# Patient Record
Sex: Female | Born: 1935 | Race: White | Hispanic: No | Smoking: Never smoker
Health system: Southern US, Community
[De-identification: ages and names within clinical notes are randomized; demographics above are authoritative.]

## PROBLEM LIST (undated history)

## (undated) DIAGNOSIS — E539 Vitamin B deficiency, unspecified: Secondary | ICD-10-CM

## (undated) DIAGNOSIS — C801 Malignant (primary) neoplasm, unspecified: Secondary | ICD-10-CM

## (undated) DIAGNOSIS — R112 Nausea with vomiting, unspecified: Secondary | ICD-10-CM

## (undated) DIAGNOSIS — I82409 Acute embolism and thrombosis of unspecified deep veins of unspecified lower extremity: Secondary | ICD-10-CM

## (undated) DIAGNOSIS — K219 Gastro-esophageal reflux disease without esophagitis: Secondary | ICD-10-CM

## (undated) DIAGNOSIS — N390 Urinary tract infection, site not specified: Secondary | ICD-10-CM

## (undated) DIAGNOSIS — T8859XA Other complications of anesthesia, initial encounter: Secondary | ICD-10-CM

## (undated) DIAGNOSIS — R42 Dizziness and giddiness: Secondary | ICD-10-CM

## (undated) DIAGNOSIS — R490 Dysphonia: Secondary | ICD-10-CM

## (undated) DIAGNOSIS — I739 Peripheral vascular disease, unspecified: Secondary | ICD-10-CM

## (undated) DIAGNOSIS — M1990 Unspecified osteoarthritis, unspecified site: Secondary | ICD-10-CM

## (undated) DIAGNOSIS — D759 Disease of blood and blood-forming organs, unspecified: Secondary | ICD-10-CM

## (undated) DIAGNOSIS — Z8249 Family history of ischemic heart disease and other diseases of the circulatory system: Secondary | ICD-10-CM

## (undated) DIAGNOSIS — Z789 Other specified health status: Secondary | ICD-10-CM

## (undated) DIAGNOSIS — Z9889 Other specified postprocedural states: Secondary | ICD-10-CM

## (undated) DIAGNOSIS — E559 Vitamin D deficiency, unspecified: Secondary | ICD-10-CM

## (undated) DIAGNOSIS — D6851 Activated protein C resistance: Secondary | ICD-10-CM

## (undated) DIAGNOSIS — R011 Cardiac murmur, unspecified: Secondary | ICD-10-CM

## (undated) DIAGNOSIS — T4145XA Adverse effect of unspecified anesthetic, initial encounter: Secondary | ICD-10-CM

## (undated) DIAGNOSIS — Z8719 Personal history of other diseases of the digestive system: Secondary | ICD-10-CM

## (undated) HISTORY — PX: BUNIONECTOMY: SHX129

## (undated) HISTORY — PX: TONSILLECTOMY: SUR1361

## (undated) HISTORY — DX: Dizziness and giddiness: R42

## (undated) HISTORY — DX: Vitamin B deficiency, unspecified: E53.9

## (undated) HISTORY — DX: Dysphonia: R49.0

## (undated) HISTORY — DX: Family history of ischemic heart disease and other diseases of the circulatory system: Z82.49

## (undated) HISTORY — PX: APPENDECTOMY: SHX54

## (undated) HISTORY — PX: CHOLECYSTECTOMY: SHX55

## (undated) HISTORY — DX: Acute embolism and thrombosis of unspecified deep veins of unspecified lower extremity: I82.409

## (undated) HISTORY — PX: ABDOMINAL HYSTERECTOMY: SHX81

## (undated) HISTORY — DX: Vitamin D deficiency, unspecified: E55.9

## (undated) HISTORY — PX: COLONOSCOPY W/ POLYPECTOMY: SHX1380

## (undated) HISTORY — DX: Activated protein C resistance: D68.51

## (undated) HISTORY — PX: HERNIA REPAIR: SHX51

---

## 1997-01-19 HISTORY — PX: CATARACT EXTRACTION: SUR2

## 2005-01-19 HISTORY — PX: JOINT REPLACEMENT: SHX530

## 2013-01-30 DIAGNOSIS — M25569 Pain in unspecified knee: Secondary | ICD-10-CM | POA: Diagnosis not present

## 2013-01-30 DIAGNOSIS — IMO0002 Reserved for concepts with insufficient information to code with codable children: Secondary | ICD-10-CM | POA: Diagnosis not present

## 2013-01-30 DIAGNOSIS — M1710 Unilateral primary osteoarthritis, unspecified knee: Secondary | ICD-10-CM | POA: Diagnosis not present

## 2013-01-31 DIAGNOSIS — M25569 Pain in unspecified knee: Secondary | ICD-10-CM | POA: Diagnosis not present

## 2013-01-31 DIAGNOSIS — M79609 Pain in unspecified limb: Secondary | ICD-10-CM | POA: Diagnosis not present

## 2013-02-07 DIAGNOSIS — Z1231 Encounter for screening mammogram for malignant neoplasm of breast: Secondary | ICD-10-CM | POA: Diagnosis not present

## 2013-02-07 LAB — HM MAMMOGRAPHY: HM MAMMO: NEGATIVE

## 2013-02-09 DIAGNOSIS — IMO0002 Reserved for concepts with insufficient information to code with codable children: Secondary | ICD-10-CM | POA: Diagnosis not present

## 2013-02-09 DIAGNOSIS — M25559 Pain in unspecified hip: Secondary | ICD-10-CM | POA: Diagnosis not present

## 2013-02-09 DIAGNOSIS — M25569 Pain in unspecified knee: Secondary | ICD-10-CM | POA: Diagnosis not present

## 2013-02-09 DIAGNOSIS — M1710 Unilateral primary osteoarthritis, unspecified knee: Secondary | ICD-10-CM | POA: Diagnosis not present

## 2013-03-08 DIAGNOSIS — M1710 Unilateral primary osteoarthritis, unspecified knee: Secondary | ICD-10-CM | POA: Diagnosis not present

## 2013-03-16 DIAGNOSIS — M7989 Other specified soft tissue disorders: Secondary | ICD-10-CM | POA: Diagnosis not present

## 2013-03-16 DIAGNOSIS — I82409 Acute embolism and thrombosis of unspecified deep veins of unspecified lower extremity: Secondary | ICD-10-CM | POA: Diagnosis not present

## 2013-03-17 DIAGNOSIS — I82409 Acute embolism and thrombosis of unspecified deep veins of unspecified lower extremity: Secondary | ICD-10-CM | POA: Diagnosis not present

## 2013-03-20 DIAGNOSIS — Z7901 Long term (current) use of anticoagulants: Secondary | ICD-10-CM | POA: Diagnosis not present

## 2013-03-20 DIAGNOSIS — I824Y9 Acute embolism and thrombosis of unspecified deep veins of unspecified proximal lower extremity: Secondary | ICD-10-CM | POA: Diagnosis not present

## 2013-03-23 DIAGNOSIS — I824Y9 Acute embolism and thrombosis of unspecified deep veins of unspecified proximal lower extremity: Secondary | ICD-10-CM | POA: Diagnosis not present

## 2013-03-23 DIAGNOSIS — Z7901 Long term (current) use of anticoagulants: Secondary | ICD-10-CM | POA: Diagnosis not present

## 2013-03-30 DIAGNOSIS — Z7901 Long term (current) use of anticoagulants: Secondary | ICD-10-CM | POA: Diagnosis not present

## 2013-03-30 DIAGNOSIS — I824Y9 Acute embolism and thrombosis of unspecified deep veins of unspecified proximal lower extremity: Secondary | ICD-10-CM | POA: Diagnosis not present

## 2013-04-05 DIAGNOSIS — I824Y9 Acute embolism and thrombosis of unspecified deep veins of unspecified proximal lower extremity: Secondary | ICD-10-CM | POA: Diagnosis not present

## 2013-04-05 DIAGNOSIS — R29818 Other symptoms and signs involving the nervous system: Secondary | ICD-10-CM | POA: Diagnosis not present

## 2013-04-05 DIAGNOSIS — R42 Dizziness and giddiness: Secondary | ICD-10-CM | POA: Diagnosis not present

## 2013-04-05 DIAGNOSIS — Z7901 Long term (current) use of anticoagulants: Secondary | ICD-10-CM | POA: Diagnosis not present

## 2013-04-12 DIAGNOSIS — I824Y9 Acute embolism and thrombosis of unspecified deep veins of unspecified proximal lower extremity: Secondary | ICD-10-CM | POA: Diagnosis not present

## 2013-04-12 DIAGNOSIS — Z7901 Long term (current) use of anticoagulants: Secondary | ICD-10-CM | POA: Diagnosis not present

## 2013-04-20 DIAGNOSIS — Z7901 Long term (current) use of anticoagulants: Secondary | ICD-10-CM | POA: Diagnosis not present

## 2013-04-20 DIAGNOSIS — I824Y9 Acute embolism and thrombosis of unspecified deep veins of unspecified proximal lower extremity: Secondary | ICD-10-CM | POA: Diagnosis not present

## 2013-04-27 DIAGNOSIS — Z7901 Long term (current) use of anticoagulants: Secondary | ICD-10-CM | POA: Diagnosis not present

## 2013-04-27 DIAGNOSIS — I824Y9 Acute embolism and thrombosis of unspecified deep veins of unspecified proximal lower extremity: Secondary | ICD-10-CM | POA: Diagnosis not present

## 2013-05-03 DIAGNOSIS — S8000XA Contusion of unspecified knee, initial encounter: Secondary | ICD-10-CM | POA: Diagnosis not present

## 2013-05-11 DIAGNOSIS — Z7901 Long term (current) use of anticoagulants: Secondary | ICD-10-CM | POA: Diagnosis not present

## 2013-05-11 DIAGNOSIS — I824Y9 Acute embolism and thrombosis of unspecified deep veins of unspecified proximal lower extremity: Secondary | ICD-10-CM | POA: Diagnosis not present

## 2013-05-26 DIAGNOSIS — Z7901 Long term (current) use of anticoagulants: Secondary | ICD-10-CM | POA: Diagnosis not present

## 2013-05-26 DIAGNOSIS — I824Y9 Acute embolism and thrombosis of unspecified deep veins of unspecified proximal lower extremity: Secondary | ICD-10-CM | POA: Diagnosis not present

## 2013-06-16 DIAGNOSIS — Z7901 Long term (current) use of anticoagulants: Secondary | ICD-10-CM | POA: Diagnosis not present

## 2013-06-16 DIAGNOSIS — I824Y9 Acute embolism and thrombosis of unspecified deep veins of unspecified proximal lower extremity: Secondary | ICD-10-CM | POA: Diagnosis not present

## 2013-06-26 DIAGNOSIS — N390 Urinary tract infection, site not specified: Secondary | ICD-10-CM | POA: Diagnosis not present

## 2013-06-26 DIAGNOSIS — R339 Retention of urine, unspecified: Secondary | ICD-10-CM | POA: Diagnosis not present

## 2013-07-06 DIAGNOSIS — IMO0002 Reserved for concepts with insufficient information to code with codable children: Secondary | ICD-10-CM | POA: Diagnosis not present

## 2013-07-06 DIAGNOSIS — I82409 Acute embolism and thrombosis of unspecified deep veins of unspecified lower extremity: Secondary | ICD-10-CM | POA: Diagnosis not present

## 2013-07-06 DIAGNOSIS — M1710 Unilateral primary osteoarthritis, unspecified knee: Secondary | ICD-10-CM | POA: Diagnosis not present

## 2013-07-07 DIAGNOSIS — N390 Urinary tract infection, site not specified: Secondary | ICD-10-CM | POA: Diagnosis not present

## 2013-07-13 DIAGNOSIS — M25569 Pain in unspecified knee: Secondary | ICD-10-CM | POA: Diagnosis not present

## 2013-07-13 DIAGNOSIS — I82409 Acute embolism and thrombosis of unspecified deep veins of unspecified lower extremity: Secondary | ICD-10-CM | POA: Diagnosis not present

## 2013-07-13 DIAGNOSIS — Z5181 Encounter for therapeutic drug level monitoring: Secondary | ICD-10-CM | POA: Diagnosis not present

## 2013-07-13 DIAGNOSIS — Z7901 Long term (current) use of anticoagulants: Secondary | ICD-10-CM | POA: Diagnosis not present

## 2013-08-04 DIAGNOSIS — I82409 Acute embolism and thrombosis of unspecified deep veins of unspecified lower extremity: Secondary | ICD-10-CM | POA: Diagnosis not present

## 2013-08-04 DIAGNOSIS — E785 Hyperlipidemia, unspecified: Secondary | ICD-10-CM | POA: Diagnosis not present

## 2013-08-05 DIAGNOSIS — I82409 Acute embolism and thrombosis of unspecified deep veins of unspecified lower extremity: Secondary | ICD-10-CM | POA: Diagnosis not present

## 2013-08-05 DIAGNOSIS — E785 Hyperlipidemia, unspecified: Secondary | ICD-10-CM | POA: Diagnosis not present

## 2013-08-10 DIAGNOSIS — Z5181 Encounter for therapeutic drug level monitoring: Secondary | ICD-10-CM | POA: Diagnosis not present

## 2013-08-10 DIAGNOSIS — Z7901 Long term (current) use of anticoagulants: Secondary | ICD-10-CM | POA: Diagnosis not present

## 2013-08-10 DIAGNOSIS — I82409 Acute embolism and thrombosis of unspecified deep veins of unspecified lower extremity: Secondary | ICD-10-CM | POA: Diagnosis not present

## 2013-08-16 DIAGNOSIS — E559 Vitamin D deficiency, unspecified: Secondary | ICD-10-CM | POA: Diagnosis not present

## 2013-08-16 DIAGNOSIS — D6859 Other primary thrombophilia: Secondary | ICD-10-CM | POA: Diagnosis not present

## 2013-08-16 DIAGNOSIS — E785 Hyperlipidemia, unspecified: Secondary | ICD-10-CM | POA: Diagnosis not present

## 2013-09-12 DIAGNOSIS — I824Y9 Acute embolism and thrombosis of unspecified deep veins of unspecified proximal lower extremity: Secondary | ICD-10-CM | POA: Diagnosis not present

## 2013-09-12 DIAGNOSIS — Z7901 Long term (current) use of anticoagulants: Secondary | ICD-10-CM | POA: Diagnosis not present

## 2013-10-02 ENCOUNTER — Telehealth: Payer: Self-pay | Admitting: Family Medicine

## 2013-10-02 NOTE — Telephone Encounter (Signed)
Note forwarded to [place].

## 2013-10-02 NOTE — Telephone Encounter (Signed)
I believe there was a misunderstanding. I said I might be able to help get her in sooner then next year. Not immediately - but will make an exception given daughter's concers if needed and can schedule in next new patient slot or block 2 15 minute slots for a new patient visit. She will need to do labs with current doctor for this month until sees [place] to establish care as can't do labs here until establishes care here.

## 2013-10-02 NOTE — Telephone Encounter (Signed)
Caller: Rebecca/Child; Phone: [phone]; Reason for Call: Rebecca/daughter calling about patient who is trying to establish with office with Dr Maudie Mercury as her physician.   Daughter spoke with Dr Maudie Mercury over the last weekend, was instructed to bring records to office and could get established in practice as well as to be able to get labs done as needed since on Coumadin for blood clot.  Right now needing labs monthly and is due for next lab.   History: Blood clot due to long drive from [place] to [place] in spring and after being stablized was allowed to return to [place].  Moved to [place] area in August - lives at the [facility].  (80 y.o.  Mother has been driving 2 hours to and from [place] to get labs done).   Patient came to office this am 9/14, could not get anyone in office to schedule appointment, was not allowed to speak with nurse, told earliest she could be seen is in Feb 2016.   Daughter very concerned about long drive for patient as well as trying to follow Dr Julianne Rice instructions to have her seen in office.   Please review - contact daughter at [phone].  Or patient can be reached at [phone].

## 2013-10-03 NOTE — Telephone Encounter (Signed)
Spoke with Pt and was able to find a NP appt on 09/24 so I did not advise her to have labs done elsewhere. Pt was satisfied.

## 2013-10-04 ENCOUNTER — Ambulatory Visit: Payer: Self-pay | Admitting: Family Medicine

## 2013-10-12 ENCOUNTER — Telehealth: Payer: Self-pay | Admitting: Family

## 2013-10-12 ENCOUNTER — Ambulatory Visit (INDEPENDENT_AMBULATORY_CARE_PROVIDER_SITE_OTHER): Payer: Medicare Other | Admitting: Family Medicine

## 2013-10-12 ENCOUNTER — Ambulatory Visit (INDEPENDENT_AMBULATORY_CARE_PROVIDER_SITE_OTHER): Payer: Medicare Other | Admitting: Family

## 2013-10-12 ENCOUNTER — Encounter: Payer: Self-pay | Admitting: Family Medicine

## 2013-10-12 VITALS — BP 122/80 | HR 88 | Temp 97.7°F | Ht 64.25 in | Wt 159.0 lb

## 2013-10-12 DIAGNOSIS — R339 Retention of urine, unspecified: Secondary | ICD-10-CM | POA: Diagnosis not present

## 2013-10-12 DIAGNOSIS — Z8249 Family history of ischemic heart disease and other diseases of the circulatory system: Secondary | ICD-10-CM | POA: Insufficient documentation

## 2013-10-12 DIAGNOSIS — Z7689 Persons encountering health services in other specified circumstances: Secondary | ICD-10-CM

## 2013-10-12 DIAGNOSIS — I82401 Acute embolism and thrombosis of unspecified deep veins of right lower extremity: Secondary | ICD-10-CM

## 2013-10-12 DIAGNOSIS — D6851 Activated protein C resistance: Secondary | ICD-10-CM | POA: Insufficient documentation

## 2013-10-12 DIAGNOSIS — Z23 Encounter for immunization: Secondary | ICD-10-CM

## 2013-10-12 DIAGNOSIS — M1990 Unspecified osteoarthritis, unspecified site: Secondary | ICD-10-CM | POA: Diagnosis not present

## 2013-10-12 DIAGNOSIS — I82409 Acute embolism and thrombosis of unspecified deep veins of unspecified lower extremity: Secondary | ICD-10-CM | POA: Diagnosis not present

## 2013-10-12 DIAGNOSIS — R490 Dysphonia: Secondary | ICD-10-CM

## 2013-10-12 DIAGNOSIS — D6859 Other primary thrombophilia: Secondary | ICD-10-CM

## 2013-10-12 DIAGNOSIS — Z7189 Other specified counseling: Secondary | ICD-10-CM | POA: Diagnosis not present

## 2013-10-12 DIAGNOSIS — J029 Acute pharyngitis, unspecified: Secondary | ICD-10-CM

## 2013-10-12 HISTORY — DX: Family history of ischemic heart disease and other diseases of the circulatory system: Z82.49

## 2013-10-12 LAB — POCT INR: INR: 4

## 2013-10-12 NOTE — Patient Instructions (Signed)
-  check on when you last had tdap  -We have ordered labs or studies at this visit. It can take up to 1-2 weeks for results and processing. We will contact you with instructions IF your results are abnormal. Normal results will be released to your [facility]. If you have not heard from [place] or can not find your results in [facility] in 2 weeks please contact our office.  -We placed a referral for you as discussed to the urologist. It usually takes about 1-2 weeks to process and schedule this referral. If you have not heard from [place] regarding this appointment in 2 weeks please contact our office.   -PLEASE SIGN UP FOR MYCHART TODAY   We recommend the following healthy lifestyle measures: - eat a healthy diet consisting of lots of vegetables, fruits, beans, nuts, seeds, healthy meats such as white chicken and fish and whole grains.  - avoid fried foods, fast food, processed foods, sodas, red meet and other fattening foods.  - get a least 150 minutes of aerobic exercise per week.   Follow up in: 3 months

## 2013-10-12 NOTE — Addendum Note (Signed)
Addended by: Agnes Lawrence on: 10/12/2013 05:35 PM   Modules accepted: Orders

## 2013-10-12 NOTE — Progress Notes (Signed)
Pre visit review using our clinic review tool, if applicable. No additional management support is needed unless otherwise documented below in the visit note. 

## 2013-10-12 NOTE — Telephone Encounter (Signed)
Hold Coumadin Thursday and Friday. Resume on Saturday, 5mg  everyday except M/W/F, 2.5mg . Recheck in 10 days.    Please provide instructions and schedule a follow-up.

## 2013-10-12 NOTE — Patient Instructions (Addendum)
Hold Coumadin Thursday and Friday. Resume on Saturday, 5mg  everyday except M/W/F, 2.5mg . Recheck in 10 days.   Anticoagulation Dose Instructions as of 10/12/2013     Dorene Grebe Tue Wed Thu Fri Sat   New Dose 5 mg 2.5 mg 5 mg 2.5 mg 5 mg 2.5 mg 5 mg    Description       Hold Coumadin Thursday and Friday. Resume on Saturday, 5mg  everyday except M/W/F, 2.5mg . Recheck in 10 days.

## 2013-10-12 NOTE — Progress Notes (Addendum)
No chief complaint on file.   HPI:  Ziara Thelander is here to establish care. Moved to [place] 5 weeks ago. Last PCP and physical: had physical exam in June  Has the following chronic problems and concerns today:  Patient Active Problem List   Diagnosis Date Noted  . DVT (deep venous thrombosis) 10/12/2013  . Arthritis, senescent 10/12/2013  . Urinary retention 10/12/2013  . Heterozygous factor V Leiden mutation 10/12/2013  . FH: pulmonary embolism 10/12/2013   DVT: -1st occurrence, occurred spontaneously after a 12 hour car ride in 02/2013 -she has been on coumadin for this  - heterozygous for factor V leiden -last inr about 1 month ago and in normal range per her report -current dose of coumadin is 2.5 mg MTFr, 5mg  all other days -FH 1st degree relative with pulm embolism -no hx bleeding, bruising, malignancy  Urinary Retention: -self caths 3 times per day -followed by a urologist in [place] - now wants to see urologist here in [place]  Vocal Chord issues: -reports pain and squeaking when singing -wants evaluation for this  ROS negative for unless reported above: fevers, unintentional weight loss, hearing or vision loss, chest pain, palpitations, struggling to breath, hemoptysis, melena, hematochezia, hematuria, falls, loc, si, thoughts of self harm  Past Medical History  Diagnosis Date  . Vitamin D deficiency   . Vitamin B deficiency   . Urinary incontinence   . DVT (deep venous thrombosis)     left lower extremity  . Heterozygous factor V Leiden mutation     Family History  Problem Relation Age of Onset  . Heart disease Mother     chf  . Cancer Father     died at age 60    History   Social History  . Marital Status: Widowed    Spouse Name: N/A    Number of Children: N/A  . Years of Education: N/A   Social History Main Topics  . Smoking status: Never Smoker   . Smokeless tobacco: None  . Alcohol Use: Yes     Comment: glass on a rare occ   . Drug Use: None  . Sexual Activity: None   Other Topics Concern  . None   Social History Narrative   Work or School: volunteers with music group      Home Situation: lives in [place], religious      Lifestyle: active at residence, walking once per week, diet is ok             Current outpatient prescriptions:acetaminophen (TYLENOL) 500 MG tablet, Take 500 mg by mouth every 6 (six) hours as needed (foot pain)., Disp: , Rfl: ;  B Complex Vitamins (B COMPLEX PO), Take by mouth daily., Disp: , Rfl: ;  Cholecalciferol (VITAMIN D PO), Take by mouth daily., Disp: , Rfl: ;  Omega-3 Fatty Acids (FISH OIL) 1000 MG CAPS, Take by mouth daily., Disp: , Rfl:  warfarin (COUMADIN) 5 MG tablet, 5 mg. Takes 2.5mg  Mon Tues and Friday-5mg  other days, Disp: , Rfl:   EXAM:  Filed Vitals:   10/12/13 1617  BP: 122/80  Pulse: 88  Temp: 97.7 F (36.5 C)    Body mass index is 27.08 kg/(m^2).  GENERAL: vitals reviewed and listed above, alert, oriented, appears well hydrated and in no acute distress  HEENT: atraumatic, conjunttiva clear, no obvious abnormalities on inspection of external nose and ears  NECK: no obvious masses on inspection  LUNGS: clear to auscultation bilaterally, no wheezes, rales or rhonchi, good air movement  CV: HRRR, no peripheral edema  MS: moves all extremities without noticeable abnormality  PSYCH: pleasant and cooperative, no obvious depression or anxiety  ASSESSMENT AND PLAN:  Discussed the following assessment and plan:  DVT (deep venous thrombosis), right -discussed treatment, options, risks/benefits -she has opted for continuation of treament with coumadin -inr today, she is to establish with coumadin clinic  Encounter to establish care  Urinary retention - Plan: Ambulatory referral to Urology  Osteoarthritis, unspecified osteoarthritis type, unspecified site  Heterozygous factor V Leiden mutation    -We reviewed the PMH, PSH, FH, SH, Meds and Allergies. -We provided refills for any medications we will prescribe as needed. -We addressed current concerns per orders and patient instructions. -We have asked for records for pertinent exams, studies, vaccines and notes from previous providers. -We have advised patient to follow up per instructions below. -prevnar 13 today,  -influenzae vaccine -provided number to contact ENT about her voice issues after discussion potential etiologies  -Patient advised to return or notify a doctor immediately if symptoms worsen or persist or new concerns arise.  Patient Instructions  -check on when you last had tdap  -We have ordered labs or studies at this visit. It can take up to 1-2 weeks for results and processing. We will contact you with instructions IF your results are abnormal. Normal results will be released to your [facility]. If you have not heard from [place] or can not find your results in [facility] in 2 weeks please contact our office.  -We placed a referral for you as discussed to the urologist. It usually takes about 1-2 weeks to process and schedule this referral. If you have not heard from [place] regarding this appointment in 2 weeks please contact our office.   -PLEASE SIGN UP FOR MYCHART TODAY   We recommend the following healthy lifestyle measures: - eat a healthy diet consisting of lots of vegetables, fruits, beans, nuts, seeds, healthy meats such as white chicken and fish and whole grains.  - avoid fried foods, fast food, processed foods, sodas, red meet and other fattening foods.  - get a least 150 minutes of aerobic exercise per week.   Follow up in: 3 months        KIM, HANNAH R.

## 2013-10-13 ENCOUNTER — Ambulatory Visit: Payer: Self-pay | Admitting: Family Medicine

## 2013-10-16 NOTE — Telephone Encounter (Signed)
Left message for pt to call back  °

## 2013-10-16 NOTE — Telephone Encounter (Signed)
Pt did receive a call with coumadin instructions and appointment was scheduled

## 2013-10-20 ENCOUNTER — Encounter: Payer: Self-pay | Admitting: Family Medicine

## 2013-10-23 DIAGNOSIS — J329 Chronic sinusitis, unspecified: Secondary | ICD-10-CM | POA: Diagnosis not present

## 2013-10-23 DIAGNOSIS — J387 Other diseases of larynx: Secondary | ICD-10-CM | POA: Diagnosis not present

## 2013-10-24 ENCOUNTER — Ambulatory Visit (INDEPENDENT_AMBULATORY_CARE_PROVIDER_SITE_OTHER): Payer: Medicare Other | Admitting: Family

## 2013-10-24 DIAGNOSIS — D688 Other specified coagulation defects: Secondary | ICD-10-CM

## 2013-10-24 DIAGNOSIS — Z8679 Personal history of other diseases of the circulatory system: Secondary | ICD-10-CM | POA: Diagnosis not present

## 2013-10-24 DIAGNOSIS — D6851 Activated protein C resistance: Secondary | ICD-10-CM

## 2013-10-24 DIAGNOSIS — Z8249 Family history of ischemic heart disease and other diseases of the circulatory system: Secondary | ICD-10-CM

## 2013-10-24 DIAGNOSIS — I82401 Acute embolism and thrombosis of unspecified deep veins of right lower extremity: Secondary | ICD-10-CM | POA: Diagnosis not present

## 2013-10-24 LAB — POCT INR: INR: 2.1

## 2013-10-24 NOTE — Patient Instructions (Signed)
5mg  everyday except M/W/F, 2.5mg . Recheck in 4 weeks.   Anticoagulation Dose Instructions as of 10/24/2013     Dorene Grebe Tue Wed Thu Fri Sat   New Dose 5 mg 2.5 mg 5 mg 2.5 mg 5 mg 2.5 mg 5 mg    Description       5mg  everyday except M/W/F, 2.5mg . Recheck in 4 weeks.

## 2013-10-25 ENCOUNTER — Encounter: Payer: Self-pay | Admitting: Family Medicine

## 2013-10-26 ENCOUNTER — Encounter: Payer: Self-pay | Admitting: Family Medicine

## 2013-11-10 DIAGNOSIS — R339 Retention of urine, unspecified: Secondary | ICD-10-CM | POA: Diagnosis not present

## 2013-11-10 DIAGNOSIS — N3942 Incontinence without sensory awareness: Secondary | ICD-10-CM | POA: Diagnosis not present

## 2013-11-10 DIAGNOSIS — N1330 Unspecified hydronephrosis: Secondary | ICD-10-CM | POA: Diagnosis not present

## 2013-11-20 ENCOUNTER — Encounter: Payer: Self-pay | Admitting: Family Medicine

## 2013-11-21 ENCOUNTER — Ambulatory Visit (INDEPENDENT_AMBULATORY_CARE_PROVIDER_SITE_OTHER): Payer: Medicare Other | Admitting: Family

## 2013-11-21 DIAGNOSIS — I82401 Acute embolism and thrombosis of unspecified deep veins of right lower extremity: Secondary | ICD-10-CM

## 2013-11-21 DIAGNOSIS — Z8679 Personal history of other diseases of the circulatory system: Secondary | ICD-10-CM | POA: Diagnosis not present

## 2013-11-21 DIAGNOSIS — D6851 Activated protein C resistance: Secondary | ICD-10-CM

## 2013-11-21 DIAGNOSIS — Z8249 Family history of ischemic heart disease and other diseases of the circulatory system: Secondary | ICD-10-CM

## 2013-11-21 DIAGNOSIS — D688 Other specified coagulation defects: Secondary | ICD-10-CM

## 2013-11-21 LAB — POCT INR: INR: 3

## 2013-11-21 NOTE — Patient Instructions (Signed)
5mg  everyday except M/W/F, 2.5mg . Recheck in 4 weeks.   Anticoagulation Dose Instructions as of 11/21/2013      Dorene Grebe Tue Wed Thu Fri Sat   New Dose 5 mg 2.5 mg 5 mg 2.5 mg 5 mg 2.5 mg 5 mg    Description        5mg  everyday except M/W/F, 2.5mg . Recheck in 4 weeks.

## 2013-12-21 ENCOUNTER — Ambulatory Visit (INDEPENDENT_AMBULATORY_CARE_PROVIDER_SITE_OTHER): Payer: Medicare Other | Admitting: Family

## 2013-12-21 DIAGNOSIS — D6851 Activated protein C resistance: Secondary | ICD-10-CM

## 2013-12-21 DIAGNOSIS — Z8679 Personal history of other diseases of the circulatory system: Secondary | ICD-10-CM | POA: Diagnosis not present

## 2013-12-21 DIAGNOSIS — I82401 Acute embolism and thrombosis of unspecified deep veins of right lower extremity: Secondary | ICD-10-CM

## 2013-12-21 DIAGNOSIS — Z8249 Family history of ischemic heart disease and other diseases of the circulatory system: Secondary | ICD-10-CM

## 2013-12-21 DIAGNOSIS — D688 Other specified coagulation defects: Secondary | ICD-10-CM

## 2013-12-21 LAB — POCT INR: INR: 5

## 2013-12-21 NOTE — Patient Instructions (Signed)
Hold Coumadin Thursday, Friday, and Saturday. Resume on Sunday. Take 1/2 tab daily except 1 tab on Tuesday and Thursdays. Recheck in 10 days.   Anticoagulation Dose Instructions as of 12/21/2013      Dorene Grebe Tue Wed Thu Fri Sat   New Dose 2.5 mg 2.5 mg 5 mg 2.5 mg 5 mg 2.5 mg 2.5 mg    Description        Hold Coumadin Thursday, Friday, and Saturday. Resume on Sunday. Take 1/2 tab daily except 1 tab on Tuesday and Thursdays. Recheck in 10 days.

## 2013-12-25 DIAGNOSIS — R0982 Postnasal drip: Secondary | ICD-10-CM | POA: Diagnosis not present

## 2013-12-25 DIAGNOSIS — J387 Other diseases of larynx: Secondary | ICD-10-CM | POA: Diagnosis not present

## 2013-12-26 ENCOUNTER — Encounter: Payer: Self-pay | Admitting: Family Medicine

## 2014-01-01 ENCOUNTER — Ambulatory Visit (INDEPENDENT_AMBULATORY_CARE_PROVIDER_SITE_OTHER): Payer: Medicare Other | Admitting: Family

## 2014-01-01 DIAGNOSIS — D688 Other specified coagulation defects: Secondary | ICD-10-CM

## 2014-01-01 DIAGNOSIS — I82401 Acute embolism and thrombosis of unspecified deep veins of right lower extremity: Secondary | ICD-10-CM

## 2014-01-01 DIAGNOSIS — Z8249 Family history of ischemic heart disease and other diseases of the circulatory system: Secondary | ICD-10-CM

## 2014-01-01 DIAGNOSIS — D6851 Activated protein C resistance: Secondary | ICD-10-CM

## 2014-01-01 DIAGNOSIS — Z8679 Personal history of other diseases of the circulatory system: Secondary | ICD-10-CM

## 2014-01-01 LAB — POCT INR: INR: 2

## 2014-01-10 ENCOUNTER — Ambulatory Visit (INDEPENDENT_AMBULATORY_CARE_PROVIDER_SITE_OTHER): Payer: Medicare Other | Admitting: Family Medicine

## 2014-01-10 ENCOUNTER — Encounter: Payer: Self-pay | Admitting: Family Medicine

## 2014-01-10 VITALS — BP 126/74 | HR 86 | Temp 98.1°F | Ht 64.25 in | Wt 159.0 lb

## 2014-01-10 DIAGNOSIS — J387 Other diseases of larynx: Secondary | ICD-10-CM | POA: Diagnosis not present

## 2014-01-10 DIAGNOSIS — R339 Retention of urine, unspecified: Secondary | ICD-10-CM

## 2014-01-10 DIAGNOSIS — I82401 Acute embolism and thrombosis of unspecified deep veins of right lower extremity: Secondary | ICD-10-CM | POA: Diagnosis not present

## 2014-01-10 DIAGNOSIS — K219 Gastro-esophageal reflux disease without esophagitis: Secondary | ICD-10-CM

## 2014-01-10 NOTE — Progress Notes (Signed)
Pre visit review using our clinic review tool, if applicable. No additional management support is needed unless otherwise documented below in the visit note. 

## 2014-01-10 NOTE — Progress Notes (Signed)
HPI:  DVT: -1st occurrence, occurred spontaneously after a 12 hour car ride in 02/2013 -she has been on coumadin for this and has opted for chronic coumadin at the last visit - but now she has changed her mind and absolutely does not want to be on coumadin any longer due to a fear of bleeding - heterozygous for factor V leiden -last inr about 1 month ago and in normal range per her report -stopped coumadin recently -no hx bleeding, bruising, malignancy  Urinary Retention: -self caths 3 times per day -followed by a urologist in [place] - referred to urologist here -seeing urologist  Vocal Chord issues: -reports pain and squeaking when singing -wanted to see ENT and options provided last visit -seeing [facility] and taking omeprazole for this and doing well  ROS: See pertinent positives and negatives per HPI.  Past Medical History  Diagnosis Date  . Vitamin D deficiency   . Vitamin B deficiency   . Urinary incontinence   . DVT (deep venous thrombosis)     left lower extremity  . Heterozygous factor V Leiden mutation     Past Surgical History  Procedure Laterality Date  . Bunionectomy    . Hernia repair    . Joint replacement  2007    hip  . Cataract extraction  1999  . Cholecystectomy    . Appendectomy    . Tonsillectomy    . Abdominal hysterectomy      Family History  Problem Relation Age of Onset  . Heart disease Mother     chf  . Cancer Father     died at age 75    History   Social History  . Marital Status: Widowed    Spouse Name: N/A    Number of Children: N/A  . Years of Education: N/A   Social History Main Topics  . Smoking status: Never Smoker   . Smokeless tobacco: None  . Alcohol Use: Yes     Comment: glass on a rare occ  . Drug Use: None  . Sexual Activity: None   Other Topics Concern  . None   Social History Narrative   Work or School: volunteers with music group      Home Situation: lives in [place], religious      Lifestyle: active at residence, walking once per week, diet is ok             Current outpatient prescriptions: acetaminophen (TYLENOL) 500 MG tablet, Take 500 mg by mouth every 6 (six) hours as needed (foot pain)., Disp: , Rfl: ;  B Complex Vitamins (B COMPLEX PO), Take by mouth daily., Disp: , Rfl: ;  Cholecalciferol (VITAMIN D PO), Take 1,000 Units by mouth daily. , Disp: , Rfl: ;  Omega-3 Fatty Acids (FISH OIL) 1000 MG CAPS, Take by mouth daily., Disp: , Rfl:  omeprazole (PRILOSEC) 40 MG capsule, Take 40 mg by mouth daily., Disp: , Rfl:   EXAM:  Filed Vitals:   01/10/14 1038  BP: 126/74  Pulse: 86  Temp: 98.1 F (36.7 C)    Body mass index is 27.08 kg/(m^2).  GENERAL: vitals reviewed and listed above, alert, oriented, appears well hydrated and in no acute distress  HEENT: atraumatic, conjunttiva clear, no obvious abnormalities on inspection of external nose and ears  NECK: no obvious masses on inspection  LUNGS: clear to auscultation bilaterally, no wheezes, rales or rhonchi, good air movement  CV: HRRR, no peripheral edema  MS: moves all extremities without noticeable abnormality  PSYCH: pleasant and cooperative, no obvious depression or anxiety  ASSESSMENT AND PLAN:  Discussed the following assessment and plan:  DVT (deep venous thrombosis), right -one occurrence after 12 hour car ride at age 80, has completed > 6 months of anticoagulation with coumadin, she initially wanted to continue chronic coumadin therapy, but now has developed a fear of bleeding and is adamant about stopping the coumadin - discussed risks/benefits. She is going to continue a low dose asa and avoid long travel.  Urinary retention -seeing urologists  LPRD (laryngopharyngeal reflux disease) -on PPI, seeing ENT, improved  -Patient advised to return or notify a doctor immediately if symptoms worsen or persist or new concerns arise.  Patient Instructions   BEFORE YOU LEAVE: -schedule medicare wellness visit in June 2016     [place], [place] R.

## 2014-01-10 NOTE — Patient Instructions (Signed)
BEFORE YOU LEAVE: -schedule medicare wellness visit in June 2016

## 2014-02-19 ENCOUNTER — Encounter: Payer: Self-pay | Admitting: Family Medicine

## 2014-02-19 ENCOUNTER — Ambulatory Visit (INDEPENDENT_AMBULATORY_CARE_PROVIDER_SITE_OTHER): Payer: Medicare Other | Admitting: Family Medicine

## 2014-02-19 ENCOUNTER — Ambulatory Visit: Payer: Self-pay | Admitting: Family Medicine

## 2014-02-19 VITALS — BP 130/72 | HR 79 | Temp 97.3°F | Ht 63.75 in | Wt 151.4 lb

## 2014-02-19 DIAGNOSIS — Z1239 Encounter for other screening for malignant neoplasm of breast: Secondary | ICD-10-CM

## 2014-02-19 DIAGNOSIS — Z23 Encounter for immunization: Secondary | ICD-10-CM | POA: Diagnosis not present

## 2014-02-19 DIAGNOSIS — M8580 Other specified disorders of bone density and structure, unspecified site: Secondary | ICD-10-CM | POA: Diagnosis not present

## 2014-02-19 DIAGNOSIS — R937 Abnormal findings on diagnostic imaging of other parts of musculoskeletal system: Secondary | ICD-10-CM

## 2014-02-19 NOTE — Progress Notes (Signed)
Pre visit review using our clinic review tool, if applicable. No additional management support is needed unless otherwise documented below in the visit note. 

## 2014-02-19 NOTE — Progress Notes (Signed)
HPI:   Acute visit for several questions:  Reports wants to do the Tdap - she thinks it has been 10 years she had her last one.  Wants to schedule her mammogram and did not know how to do this. No problems with her breast. No breast pain or lumps or discharge.  Hx Vit D def: -wonders if should to DEXA screening -reports: always normal dexa and done in the last 1-2 years -denies: hx fx, hx fx in mother, RA, hx glucocorticoids, excessive alcohol -denies hx of osteoporosis or osteopenia  ROS: See pertinent positives and negatives per HPI.  Past Medical History  Diagnosis Date  . Vitamin D deficiency   . Vitamin B deficiency   . Urinary incontinence   . DVT (deep venous thrombosis)     left lower extremity  . Heterozygous factor V Leiden mutation     Past Surgical History  Procedure Laterality Date  . Bunionectomy    . Hernia repair    . Joint replacement  2007    hip  . Cataract extraction  1999  . Cholecystectomy    . Appendectomy    . Tonsillectomy    . Abdominal hysterectomy      Family History  Problem Relation Age of Onset  . Heart disease Mother     chf  . Cancer Father     died at age 56    History   Social History  . Marital Status: Widowed    Spouse Name: N/A    Number of Children: N/A  . Years of Education: N/A   Social History Main Topics  . Smoking status: Never Smoker   . Smokeless tobacco: None  . Alcohol Use: Yes     Comment: glass on a rare occ  . Drug Use: None  . Sexual Activity: None   Other Topics Concern  . None   Social History Narrative   Work or School: volunteers with music group      Home Situation: lives in [place], religious      Lifestyle: active at residence, walking once per week, diet is ok              Current outpatient prescriptions:  .  acetaminophen (TYLENOL) 500 MG tablet, Take 500 mg by mouth every 6 (six) hours as needed (foot pain)., Disp: , Rfl:  .   B Complex Vitamins (B COMPLEX PO), Take by mouth daily., Disp: , Rfl:  .  Cholecalciferol (VITAMIN D PO), Take 1,000 Units by mouth daily. , Disp: , Rfl:  .  Omega-3 Fatty Acids (FISH OIL) 1000 MG CAPS, Take by mouth daily., Disp: , Rfl:  .  omeprazole (PRILOSEC) 40 MG capsule, Take 40 mg by mouth daily., Disp: , Rfl:   EXAM:  Filed Vitals:   02/19/14 1413  BP: 130/72  Pulse: 79  Temp: 97.3 F (36.3 C)    Body mass index is 26.2 kg/(m^2).  GENERAL: vitals reviewed and listed above, alert, oriented, appears well hydrated and in no acute distress  HEENT: atraumatic, conjunttiva clear, no obvious abnormalities on inspection of external nose and ears  NECK: no obvious masses on inspection  LUNGS: clear to auscultation bilaterally, no wheezes, rales or rhonchi, good air movement  CV: HRRR, no peripheral edema  MS: moves all extremities without noticeable abnormality  PSYCH: pleasant and cooperative, no obvious depression or anxiety  ASSESSMENT AND PLAN:  Discussed the following assessment and  plan:  Breast cancer screening  Need for diphtheria-tetanus-pertussis (Tdap) vaccine, adult/adolescent  Abnormal bone density screening   -Tdap today -number provided for her to schedule her mammogram -discussed dexa and she will get prior report -Patient advised to return or notify a doctor immediately if symptoms worsen or persist or new concerns arise.  Patient Instructions  Before you leave: -Tdap  Schedule your mammogram       Diane Roach.

## 2014-02-19 NOTE — Addendum Note (Signed)
Addended by: Agnes Lawrence on: 02/19/2014 02:48 PM   Modules accepted: Orders

## 2014-02-19 NOTE — Patient Instructions (Signed)
Before you leave: -Tdap  Schedule your mammogram

## 2014-02-20 ENCOUNTER — Other Ambulatory Visit: Payer: Self-pay

## 2014-02-20 DIAGNOSIS — Z1231 Encounter for screening mammogram for malignant neoplasm of breast: Secondary | ICD-10-CM

## 2014-03-06 ENCOUNTER — Ambulatory Visit
Admission: RE | Admit: 2014-03-06 | Discharge: 2014-03-06 | Disposition: A | Discharge status: Home / Self Care | Payer: Medicare Other | Source: Ambulatory Visit

## 2014-03-06 DIAGNOSIS — Z1231 Encounter for screening mammogram for malignant neoplasm of breast: Secondary | ICD-10-CM | POA: Diagnosis not present

## 2014-03-26 DIAGNOSIS — R0982 Postnasal drip: Secondary | ICD-10-CM | POA: Diagnosis not present

## 2014-03-26 DIAGNOSIS — R05 Cough: Secondary | ICD-10-CM | POA: Diagnosis not present

## 2014-03-26 DIAGNOSIS — R490 Dysphonia: Secondary | ICD-10-CM | POA: Diagnosis not present

## 2014-04-02 ENCOUNTER — Other Ambulatory Visit (HOSPITAL_COMMUNITY): Payer: Self-pay | Admitting: Otolaryngology

## 2014-04-10 ENCOUNTER — Ambulatory Visit: Payer: Medicare Other | Admitting: Family Medicine

## 2014-04-17 ENCOUNTER — Ambulatory Visit (INDEPENDENT_AMBULATORY_CARE_PROVIDER_SITE_OTHER): Payer: Medicare Other | Admitting: Family Medicine

## 2014-04-17 VITALS — BP 116/80 | HR 97 | Temp 97.4°F | Ht 63.75 in | Wt 161.6 lb

## 2014-04-17 DIAGNOSIS — E559 Vitamin D deficiency, unspecified: Secondary | ICD-10-CM

## 2014-04-17 DIAGNOSIS — E2839 Other primary ovarian failure: Secondary | ICD-10-CM

## 2014-04-17 DIAGNOSIS — R05 Cough: Secondary | ICD-10-CM | POA: Diagnosis not present

## 2014-04-17 DIAGNOSIS — R053 Chronic cough: Secondary | ICD-10-CM

## 2014-04-17 DIAGNOSIS — R0982 Postnasal drip: Secondary | ICD-10-CM | POA: Diagnosis not present

## 2014-04-17 MED ORDER — FLUTICASONE PROPIONATE 50 MCG/ACT NA SUSP: 2.0000 | Freq: Every day | NASAL | Status: DC

## 2014-04-17 NOTE — Progress Notes (Signed)
Pre visit review using our clinic review tool, if applicable. No additional management support is needed unless otherwise documented below in the visit note. 

## 2014-04-17 NOTE — Patient Instructions (Addendum)
Try the treatment with the ENT doctor and flonase 2 sprays each nostril for 1-2 months to see if this helps your cough  If not and you wish to see the pulmonologist please let me know

## 2014-04-17 NOTE — Progress Notes (Signed)
HPI:   Hx Vit D def: -she obtained her last DEXA and this was normal in 2011, she would like to do a screening DEXA -reports: always normal dexa  -denies: hx fx, hx fx in mother, RA, hx glucocorticoids, excessive alcohol -denies hx of osteoporosis or osteopenia -takes vitamin, she is walking on a fairly regular basis and she does and exercise group 2x per week  Cough, fatigue with singing: -hx of cough for several years -not worse or better -some PND chronically as well -has some acid issues but takes ppi for this and did not help -has seen ENT for this and has bowed vocal cords and is getting eval with ENT for this -no hx of asthma or smoking, but mother was a smoker  ROS: See pertinent positives and negatives per HPI.  Past Medical History  Diagnosis Date  . Vitamin D deficiency   . Vitamin B deficiency   . Urinary incontinence   . DVT (deep venous thrombosis)     left lower extremity  . Heterozygous factor V Leiden mutation     Past Surgical History  Procedure Laterality Date  . Bunionectomy    . Hernia repair    . Joint replacement  2007    hip  . Cataract extraction  1999  . Cholecystectomy    . Appendectomy    . Tonsillectomy    . Abdominal hysterectomy      Family History  Problem Relation Age of Onset  . Heart disease Mother     chf  . Cancer Father     died at age 24    History   Social History  . Marital Status: Widowed    Spouse Name: N/A  . Number of Children: N/A  . Years of Education: N/A   Social History Main Topics  . Smoking status: Never Smoker   . Smokeless tobacco: Not on file  . Alcohol Use: Yes     Comment: glass on a rare occ  . Drug Use: Not on file  . Sexual Activity: Not on file   Other Topics Concern  . Not on file   Social History Narrative   Work or School: volunteers with music group      Home Situation: [place]      Spiritual Beliefs: Baptist, religious      Lifestyle: active at  residence, walking once per week, diet is ok              Current outpatient prescriptions:  .  acetaminophen (TYLENOL) 500 MG tablet, Take 500 mg by mouth every 6 (six) hours as needed (foot pain)., Disp: , Rfl:  .  B Complex Vitamins (B COMPLEX PO), Take 1 tablet by mouth daily. , Disp: , Rfl:  .  Cholecalciferol (VITAMIN D PO), Take 1,000 Units by mouth daily. , Disp: , Rfl:  .  Omega-3 Fatty Acids (FISH OIL) 1000 MG CAPS, Take 1 capsule by mouth daily. , Disp: , Rfl:  .  omeprazole (PRILOSEC) 40 MG capsule, Take 40 mg by mouth daily., Disp: , Rfl:  .  fluticasone (FLONASE) 50 MCG/ACT nasal spray, Place 2 sprays into both nostrils daily., Disp: 16 g, Rfl: 6  EXAM:  Filed Vitals:   04/17/14 1254  BP: 116/80  Pulse: 97  Temp: 97.4 F (36.3 C)    Body mass index is 27.97 kg/(m^2).  GENERAL: vitals reviewed and listed above, alert, oriented, appears well hydrated and in no acute distress  HEENT: atraumatic, conjunttiva clear, no obvious abnormalities on inspection of external nose and ears  NECK: no obvious masses on inspection  LUNGS: clear to auscultation bilaterally, no wheezes, rales or rhonchi, good air movement  CV: HRRR, no peripheral edema  MS: moves all extremities without noticeable abnormality  PSYCH: pleasant and cooperative, no obvious depression or anxiety  ASSESSMENT AND PLAN:  Discussed the following assessment and plan:  Vitamin D deficiency - Plan: DG Bone Density  Chronic cough - Plan: DG Chest 2 View -discussed common and serious causes of chronic cough -opted for treatment ENT as planned and with INS and CXR and eval with pulm if persists -she is a very anxcious woman and offered echo and CT chest for her many fears, she opted to hold on these for now  Estrogen deficiency - Plan: DG Bone Density  PND (post-nasal drip) - Plan: fluticasone (FLONASE) 50 MCG/ACT nasal spray  -Patient advised to return or notify a doctor immediately if symptoms  worsen or persist or new concerns arise.  Patient Instructions  Try the treatment with the ENT doctor and flonase 2 sprays each nostril for 1-2 months to see if this helps your cough  If not and you wish to see the pulmonologist please let me know      Colin Benton R.

## 2014-04-19 ENCOUNTER — Other Ambulatory Visit (HOSPITAL_COMMUNITY): Payer: Self-pay | Admitting: Otolaryngology

## 2014-04-19 ENCOUNTER — Other Ambulatory Visit (HOSPITAL_COMMUNITY): Payer: Self-pay | Admitting: *Deleted

## 2014-04-19 ENCOUNTER — Encounter (HOSPITAL_COMMUNITY): Payer: Self-pay

## 2014-04-19 ENCOUNTER — Encounter (HOSPITAL_COMMUNITY)
Admission: RE | Admit: 2014-04-19 | Discharge: 2014-04-19 | Disposition: A | Discharge status: Home / Self Care | Payer: Medicare Other | Source: Ambulatory Visit | Attending: Otolaryngology | Admitting: Otolaryngology

## 2014-04-19 DIAGNOSIS — Z01812 Encounter for preprocedural laboratory examination: Secondary | ICD-10-CM | POA: Diagnosis not present

## 2014-04-19 DIAGNOSIS — R490 Dysphonia: Secondary | ICD-10-CM | POA: Diagnosis not present

## 2014-04-19 HISTORY — DX: Urinary tract infection, site not specified: N39.0

## 2014-04-19 HISTORY — DX: Cardiac murmur, unspecified: R01.1

## 2014-04-19 HISTORY — DX: Unspecified osteoarthritis, unspecified site: M19.90

## 2014-04-19 HISTORY — DX: Malignant (primary) neoplasm, unspecified: C80.1

## 2014-04-19 HISTORY — DX: Gastro-esophageal reflux disease without esophagitis: K21.9

## 2014-04-19 HISTORY — DX: Personal history of other diseases of the digestive system: Z87.19

## 2014-04-19 HISTORY — DX: Other specified health status: Z78.9

## 2014-04-19 HISTORY — DX: Disease of blood and blood-forming organs, unspecified: D75.9

## 2014-04-19 LAB — CBC
HEMATOCRIT: 41.3 % (ref 36.0–46.0)
HEMOGLOBIN: 13.2 g/dL (ref 12.0–15.0)
MCH: 26.5 pg (ref 26.0–34.0)
MCHC: 32 g/dL (ref 30.0–36.0)
MCV: 82.9 fL (ref 78.0–100.0)
Platelets: 154 10*3/uL (ref 150–400)
RBC: 4.98 MIL/uL (ref 3.87–5.11)
RDW: 14.5 % (ref 11.5–15.5)
WBC: 6.5 10*3/uL (ref 4.0–10.5)

## 2014-04-19 NOTE — Progress Notes (Signed)
Pre-op orders not signed in [facility]. Called Dr. Redmond Baseman' office and spoke with Luetta Nutting, she states Dr. Redmond Baseman' is out office this week and she will remind him to sign them when he returns.

## 2014-04-19 NOTE — Pre-Procedure Instructions (Signed)
Sonora Catlin  04/19/2014   Your procedure is scheduled on:  Monday, April 30, 2014 at 10:30 AM.   Report to [facility] Entrance "A" Admitting Office at 8:30 AM.   Call this number if you have problems the morning of surgery: [phone]               Any questions prior to day of surgery, please call [phone] between 8 & 4 PM.   Remember:   Do not eat food or drink liquids after midnight Sunday, 04/29/14   Take these medicines the morning of surgery with A SIP OF WATER: Omeprazole (Prilosec), Tylenol - if needed.  Stop Fish Oil 7 days prior to surgery.   Do not wear jewelry, make-up or nail polish.  Do not wear lotions, powders, or perfumes. You may wear deodorant.  Do not shave 48 hours prior to surgery.   Do not bring valuables to the hospital.  [facility] is not responsible                  for any belongings or valuables.               Contacts, dentures or bridgework may not be worn into surgery.  Leave suitcase in the car. After surgery it may be brought to your room.  For patients admitted to the hospital, discharge time is determined by your                treatment team.               Patients discharged the day of surgery will not be allowed to drive home.    Special Instructions: [place] - Preparing for Surgery  Before surgery, you can play an important role.  Because skin is not sterile, your skin needs to be as free of germs as possible.  You can reduce the number of germs on you skin by washing with CHG (chlorahexidine gluconate) soap before surgery.  CHG is an antiseptic cleaner which kills germs and bonds with the skin to continue killing germs even after washing.  Please DO NOT use if you have an allergy to CHG or antibacterial soaps.  If your skin becomes reddened/irritated stop using the CHG and inform your nurse when you arrive at Short Stay.  Do not shave (including legs and underarms) for at least 48 hours prior to the first CHG shower.   You may shave your face.  Please follow these instructions carefully:   1.  Shower with CHG Soap the night before surgery and the                                morning of Surgery.  2.  If you choose to wash your hair, wash your hair first as usual with your       normal shampoo.  3.  After you shampoo, rinse your hair and body thoroughly to remove the                      Shampoo.  4.  Use CHG as you would any other liquid soap.  You can apply chg directly       to the skin and wash gently with scrungie or a clean washcloth.  5.  Apply the CHG Soap to your body ONLY FROM THE NECK DOWN.        Do  not use on open wounds or open sores.  Avoid contact with your eyes, ears, mouth and genitals (private parts).  Wash genitals (private parts) with your normal soap.  6.  Wash thoroughly, paying special attention to the area where your surgery        will be performed.  7.  Thoroughly rinse your body with warm water from the neck down.  8.  DO NOT shower/wash with your normal soap after using and rinsing off       the CHG Soap.  9.  Pat yourself dry with a clean towel.            10.  Wear clean pajamas.            11.  Place clean sheets on your bed the night of your first shower and do not        sleep with pets.  Day of Surgery  Do not apply any lotions the morning of surgery.  Please wear clean clothes to the hospital.     Please read over the following fact sheets that you were given: Pain Booklet, Coughing and Deep Breathing and Surgical Site Infection Prevention

## 2014-04-30 ENCOUNTER — Ambulatory Visit (HOSPITAL_COMMUNITY): Payer: Medicare Other | Admitting: Anesthesiology

## 2014-04-30 ENCOUNTER — Encounter (HOSPITAL_COMMUNITY)
Admission: RE | Disposition: A | Discharge status: Home / Self Care | Payer: Self-pay | Source: Ambulatory Visit | Attending: Otolaryngology

## 2014-04-30 ENCOUNTER — Ambulatory Visit (HOSPITAL_COMMUNITY)
Admission: RE | Admit: 2014-04-30 | Discharge: 2014-04-30 | Disposition: A | Discharge status: Home / Self Care | Payer: Medicare Other | Source: Ambulatory Visit | Attending: Otolaryngology | Admitting: Otolaryngology

## 2014-04-30 ENCOUNTER — Encounter (HOSPITAL_COMMUNITY): Payer: Self-pay | Admitting: *Deleted

## 2014-04-30 DIAGNOSIS — Z7982 Long term (current) use of aspirin: Secondary | ICD-10-CM | POA: Insufficient documentation

## 2014-04-30 DIAGNOSIS — D759 Disease of blood and blood-forming organs, unspecified: Secondary | ICD-10-CM | POA: Diagnosis not present

## 2014-04-30 DIAGNOSIS — R490 Dysphonia: Secondary | ICD-10-CM | POA: Insufficient documentation

## 2014-04-30 DIAGNOSIS — K449 Diaphragmatic hernia without obstruction or gangrene: Secondary | ICD-10-CM | POA: Insufficient documentation

## 2014-04-30 DIAGNOSIS — Z90710 Acquired absence of both cervix and uterus: Secondary | ICD-10-CM | POA: Insufficient documentation

## 2014-04-30 DIAGNOSIS — K219 Gastro-esophageal reflux disease without esophagitis: Secondary | ICD-10-CM | POA: Insufficient documentation

## 2014-04-30 DIAGNOSIS — Z86010 Personal history of colonic polyps: Secondary | ICD-10-CM | POA: Insufficient documentation

## 2014-04-30 DIAGNOSIS — E559 Vitamin D deficiency, unspecified: Secondary | ICD-10-CM | POA: Diagnosis not present

## 2014-04-30 DIAGNOSIS — Z86718 Personal history of other venous thrombosis and embolism: Secondary | ICD-10-CM | POA: Diagnosis not present

## 2014-04-30 DIAGNOSIS — Z87440 Personal history of urinary (tract) infections: Secondary | ICD-10-CM | POA: Insufficient documentation

## 2014-04-30 DIAGNOSIS — M1990 Unspecified osteoarthritis, unspecified site: Secondary | ICD-10-CM | POA: Diagnosis not present

## 2014-04-30 DIAGNOSIS — J383 Other diseases of vocal cords: Secondary | ICD-10-CM | POA: Diagnosis not present

## 2014-04-30 DIAGNOSIS — Z9049 Acquired absence of other specified parts of digestive tract: Secondary | ICD-10-CM | POA: Insufficient documentation

## 2014-04-30 HISTORY — PX: MICROLARYNGOSCOPY W/VOCAL CORD INJECTION: SHX2665

## 2014-04-30 SURGERY — MICROLARYNGOSCOPY, WITH VOCAL CORD INJECTION
Anesthesia: General | Site: Neck

## 2014-04-30 MED ORDER — FENTANYL CITRATE 0.05 MG/ML IJ SOLN
INTRAMUSCULAR | Status: DC | PRN
  Administered 2014-04-30 (×2): 50 ug via INTRAVENOUS

## 2014-04-30 MED ORDER — PROPOFOL INFUSION 10 MG/ML OPTIME
INTRAVENOUS | Status: DC | PRN
  Administered 2014-04-30: 75 ug/kg/min via INTRAVENOUS

## 2014-04-30 MED ORDER — GLYCOPYRROLATE 0.2 MG/ML IJ SOLN
INTRAMUSCULAR | Status: DC | PRN
  Administered 2014-04-30: 0.4 mg via INTRAVENOUS

## 2014-04-30 MED ORDER — ONDANSETRON HCL 4 MG/2ML IJ SOLN
INTRAMUSCULAR | Status: DC | PRN
Start: 2014-04-30 — End: 2014-04-30
  Administered 2014-04-30: 4 mg via INTRAVENOUS

## 2014-04-30 MED ORDER — DEXAMETHASONE SODIUM PHOSPHATE 4 MG/ML IJ SOLN
INTRAMUSCULAR | Status: DC | PRN
  Administered 2014-04-30: 8 mg via INTRAVENOUS

## 2014-04-30 MED ORDER — PROPOFOL 10 MG/ML IV BOLUS
INTRAVENOUS | Status: AC
  Filled 2014-04-30: qty 20

## 2014-04-30 MED ORDER — NEOSTIGMINE METHYLSULFATE 10 MG/10ML IV SOLN
INTRAVENOUS | Status: DC | PRN
  Administered 2014-04-30: 3 mg via INTRAVENOUS

## 2014-04-30 MED ORDER — PROPOFOL INFUSION 10 MG/ML OPTIME
INTRAVENOUS | Status: DC | PRN
  Administered 2014-04-30: 150 mL via INTRAVENOUS

## 2014-04-30 MED ORDER — MIDAZOLAM HCL 5 MG/5ML IJ SOLN
INTRAMUSCULAR | Status: DC | PRN
  Administered 2014-04-30: 2 mg via INTRAVENOUS

## 2014-04-30 MED ORDER — TRIAMCINOLONE ACETONIDE 40 MG/ML IJ SUSP
INTRAMUSCULAR | Status: AC
  Filled 2014-04-30: qty 5

## 2014-04-30 MED ORDER — LACTATED RINGERS IV SOLN
INTRAVENOUS | Status: DC
  Administered 2014-04-30: 09:00:00 via INTRAVENOUS

## 2014-04-30 MED ORDER — EPINEPHRINE HCL 1 MG/ML IJ SOLN
INTRAMUSCULAR | Status: DC | PRN
  Administered 2014-04-30: .5 mL via ENDOTRACHEOPULMONARY

## 2014-04-30 MED ORDER — MIDAZOLAM HCL 2 MG/2ML IJ SOLN
INTRAMUSCULAR | Status: AC
  Filled 2014-04-30: qty 2

## 2014-04-30 MED ORDER — LACTATED RINGERS IV SOLN
INTRAVENOUS | Status: DC | PRN
Start: 2014-04-30 — End: 2014-04-30
  Administered 2014-04-30: 10:00:00 via INTRAVENOUS

## 2014-04-30 MED ORDER — ROCURONIUM BROMIDE 100 MG/10ML IV SOLN
INTRAVENOUS | Status: DC | PRN
Start: 2014-04-30 — End: 2014-04-30
  Administered 2014-04-30: 20 mg via INTRAVENOUS

## 2014-04-30 MED ORDER — EPINEPHRINE HCL (NASAL) 0.1 % NA SOLN
NASAL | Status: AC
  Filled 2014-04-30: qty 30

## 2014-04-30 MED ORDER — FENTANYL CITRATE 0.05 MG/ML IJ SOLN
INTRAMUSCULAR | Status: AC
  Filled 2014-04-30: qty 5

## 2014-04-30 MED ORDER — GLYCOPYRROLATE 0.2 MG/ML IJ SOLN
INTRAMUSCULAR | Status: AC
  Filled 2014-04-30: qty 1

## 2014-04-30 SURGICAL SUPPLY — 26 items
BLADE SURG 15 STRL LF DISP TIS (BLADE) IMPLANT
BLADE SURG 15 STRL SS (BLADE)
CANISTER SUCTION 2500CC (MISCELLANEOUS) ×2 IMPLANT
CONT SPEC 4OZ CLIKSEAL STRL BL (MISCELLANEOUS) IMPLANT
COVER TABLE BACK 60X90 (DRAPES) ×2 IMPLANT
CRADLE DONUT ADULT HEAD (MISCELLANEOUS) IMPLANT
DRAPE CAMERA CLOSED 9X96 (DRAPES) IMPLANT
DRAPE PROXIMA HALF (DRAPES) ×2 IMPLANT
GAUZE SPONGE 4X4 12PLY STRL (GAUZE/BANDAGES/DRESSINGS) ×2 IMPLANT
GLOVE BIO SURGEON STRL SZ7.5 (GLOVE) ×2 IMPLANT
GLOVE SURG SS PI 7.0 STRL IVOR (GLOVE) ×4 IMPLANT
GOWN STRL REUS W/ TWL LRG LVL3 (GOWN DISPOSABLE) IMPLANT
GOWN STRL REUS W/TWL LRG LVL3 (GOWN DISPOSABLE)
KIT PROLARN PLUS GEL W/NDL (Prosthesis and Implant ENT) ×2 IMPLANT
KIT ROOM TURNOVER OR (KITS) ×2 IMPLANT
MARKER SKIN DUAL TIP RULER LAB (MISCELLANEOUS) IMPLANT
NEEDLE HYPO 25GX1X1/2 BEV (NEEDLE) IMPLANT
NEEDLE TRANS ORAL INJECTION (NEEDLE) IMPLANT
NS IRRIG 1000ML POUR BTL (IV SOLUTION) ×2 IMPLANT
PAD ARMBOARD 7.5X6 YLW CONV (MISCELLANEOUS) ×4 IMPLANT
PATTIES SURGICAL .5 X3 (DISPOSABLE) ×2 IMPLANT
SOLUTION ANTI FOG 6CC (MISCELLANEOUS) IMPLANT
SURGILUBE 2OZ TUBE FLIPTOP (MISCELLANEOUS) IMPLANT
TOWEL OR 17X24 6PK STRL BLUE (TOWEL DISPOSABLE) ×4 IMPLANT
TUBE CONNECTING 12X1/4 (SUCTIONS) ×2 IMPLANT
WATER STERILE IRR 1000ML POUR (IV SOLUTION) IMPLANT

## 2014-04-30 NOTE — Brief Op Note (Signed)
04/30/2014  11:33 AM  PATIENT:  Diane Roach  81 y.o. female  PRE-OPERATIVE DIAGNOSIS:  dysphonia  POST-OPERATIVE DIAGNOSIS:  same  PROCEDURE:  Procedure(s) with comments: MICROLARYNGOSCOPY WITH VOCAL CORD INJECTION (N/A) - Micro Direct Laryngoscopy with Prolaryn injection/jet ventilation  SURGEON:  Surgeon(s) and Role:    * Melida Quitter, MD - Primary  PHYSICIAN ASSISTANT:   ASSISTANTS: none   ANESTHESIA:   general  EBL:     BLOOD ADMINISTERED:none  DRAINS: none   LOCAL MEDICATIONS USED:  NONE  SPECIMEN:  No Specimen  DISPOSITION OF SPECIMEN:  N/A  COUNTS:  YES  TOURNIQUET:  * No tourniquets in log *  DICTATION: .Other Dictation: Dictation Number [id number]  PLAN OF CARE: Discharge to home after PACU  PATIENT DISPOSITION:  PACU - hemodynamically stable.   Delay start of Pharmacological VTE agent (>24hrs) due to surgical blood loss or risk of bleeding: no

## 2014-04-30 NOTE — Transfer of Care (Signed)
Immediate Anesthesia Transfer of Care Note  Patient: Diane Roach  Procedure(s) Performed: Procedure(s) with comments: MICROLARYNGOSCOPY WITH VOCAL CORD INJECTION (N/A) - Micro Direct Laryngoscopy with Prolaryn injection/jet ventilation  Patient Location: PACU  Anesthesia Type:General  Level of Consciousness: awake, alert , oriented and patient cooperative  Airway & Oxygen Therapy: Patient Spontanous Breathing  Post-op Assessment: Report given to RN and Post -op Vital signs reviewed and stable  Post vital signs: Reviewed and stable  Last Vitals:  Filed Vitals:   04/30/14 0836  BP: 142/65  Pulse: 70  Temp: 36.3 C  Resp: 20    Complications: No apparent anesthesia complications

## 2014-04-30 NOTE — Anesthesia Preprocedure Evaluation (Addendum)
Anesthesia Evaluation  Patient identified by MRN, date of birth, ID band Patient awake    Reviewed: Allergy & Precautions, NPO status , Patient's Chart, lab work & pertinent test results  Airway Mallampati: II       Dental   Pulmonary  breath sounds clear to auscultation        Cardiovascular DVT + Valvular Problems/Murmurs Rhythm:Regular Rate:Normal     Neuro/Psych    GI/Hepatic hiatal hernia, GERD-  Controlled and Medicated,  Endo/Other    Renal/GU      Musculoskeletal  (+) Arthritis -,   Abdominal   Peds  Hematology  (+) Blood dyscrasia (Factor Jana Half), ,   Anesthesia Other Findings   Reproductive/Obstetrics                            Anesthesia Physical Anesthesia Plan  ASA: II  Anesthesia Plan: General   Post-op Pain Management:    Induction: Intravenous  Airway Management Planned:   Additional Equipment:   Intra-op Plan:   Post-operative Plan: Extubation in OR  Informed Consent:   Plan Discussed with: CRNA, Anesthesiologist and Surgeon  Anesthesia Plan Comments:         Anesthesia Quick Evaluation

## 2014-04-30 NOTE — Anesthesia Postprocedure Evaluation (Signed)
  Anesthesia Post-op Note  Patient: Diane Roach  Procedure(s) Performed: Procedure(s) with comments: MICROLARYNGOSCOPY WITH VOCAL CORD INJECTION (N/A) - Micro Direct Laryngoscopy with Prolaryn injection/jet ventilation  Patient Location: PACU  Anesthesia Type: General   Level of Consciousness: awake, alert  and oriented  Airway and Oxygen Therapy: Patient Spontanous Breathing  Post-op Pain: mild  Post-op Assessment: Post-op Vital signs reviewed  Post-op Vital Signs: Reviewed  Last Vitals:  Filed Vitals:   04/30/14 1245  BP:   Pulse: 53  Temp:   Resp: 14    Complications: No apparent anesthesia complications

## 2014-04-30 NOTE — H&P (Signed)
Diane Roach is an 81 y.o. female.   Chief Complaint: Hoarseness HPI: 81 year old female with about one year of hoarseness that is worse with more talking.  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  Past Medical History  Diagnosis Date  . Vitamin D deficiency   . Vitamin B deficiency   . Urinary incontinence   . DVT (deep venous thrombosis)     left lower extremity  . Heterozygous factor V Leiden mutation   . Heart murmur     was told years ago she had a murmur but no one mentions it now.  . Frequent UTI     hx of   . GERD (gastroesophageal reflux disease)   . History of hiatal hernia   . Arthritis   . Cancer     skin cancer (removed)  . Blood dyscrasia     low platelets  . Intermittent self-catheterization of bladder     Past Surgical History  Procedure Laterality Date  . Bunionectomy Bilateral   . Hernia repair    . Cataract extraction Left 1999    with lens implant  . Cholecystectomy    . Appendectomy    . Tonsillectomy    . Abdominal hysterectomy    . Joint replacement Right 2007    hip  . Colonoscopy w/ polypectomy      Family History  Problem Relation Age of Onset  . Heart disease Mother     chf  . Cancer Father     died at age 36   Social History:  reports that she has never smoked. She has never used smokeless tobacco. She reports that she drinks alcohol. She reports that she does not use illicit drugs.  Allergies:  Allergies  Allergen Reactions  . Amoxicillin Nausea Only  . Penicillins Nausea Only  . Prednisone Rash    Medications Prior to Admission  Medication Sig Dispense Refill  . acetaminophen (TYLENOL) 500 MG tablet Take 500 mg by mouth every 6 (six) hours as needed (foot pain).    Marland Kitchen aspirin EC 81 MG tablet Take 81 mg by mouth daily.    . B Complex Vitamins (B COMPLEX PO) Take 1 tablet by mouth daily.     . Cholecalciferol (VITAMIN D PO) Take 1,000 Units by mouth daily.     . Omega-3 Fatty Acids (FISH  OIL) 1000 MG CAPS Take 1 capsule by mouth daily.     Marland Kitchen omeprazole (PRILOSEC) 40 MG capsule Take 40 mg by mouth daily.    . fluticasone (FLONASE) 50 MCG/ACT nasal spray Place 2 sprays into both nostrils daily. 16 g 6    No results found for this or any previous visit (from the past 48 hour(s)). No results found.  Review of Systems  All other systems reviewed and are negative.   Blood pressure 142/65, pulse 70, temperature 97.4 F (36.3 C), temperature source Oral, resp. rate 20, height 5\' 4"  (1.626 m), weight 73.029 kg (161 lb), SpO2 98 %. Physical Exam  Constitutional: She is oriented to person, place, and time. She appears well-developed and well-nourished. No distress.  HENT:  Head: Normocephalic and atraumatic.  Right Ear: External ear normal.  Left Ear: External ear normal.  Nose: Nose normal.  Mouth/Throat: Oropharynx is clear and moist.  Eyes: Conjunctivae and EOM are normal. Pupils are equal, round, and reactive to light.  Neck: Normal range of motion. Neck supple.  Cardiovascular: Normal rate.   Respiratory: Effort normal.  Musculoskeletal: Normal range of motion.  Neurological: She is alert and oriented to person, place, and time. No cranial nerve deficit.  Skin: Skin is warm and dry.  Psychiatric: She has a normal mood and affect. Her behavior is normal. Judgment and thought content normal.     Assessment/Plan Dysphonia, presbylaryngis To OR for SMDL with Prolaryn injections.  Aoki Wedemeyer 04/30/2014, 10:56 AM

## 2014-05-01 ENCOUNTER — Encounter (HOSPITAL_COMMUNITY): Payer: Self-pay | Admitting: Otolaryngology

## 2014-05-01 NOTE — Op Note (Signed)
NAME:  NOELLE, SEASE NO.:  [id number]  MEDICAL RECORD NO.:  [id number]  LOCATION:                                 FACILITY:  PHYSICIAN:  Onnie Graham, MD     DATE OF BIRTH:  08/21/1930  DATE OF PROCEDURE:  04/30/2014 DATE OF DISCHARGE:  04/30/2014                              OPERATIVE REPORT   PREOPERATIVE DIAGNOSIS:  Dysphonia and presbylarynges.  POSTOPERATIVE DIAGNOSIS:  Dysphonia and presbylarynges.  PROCEDURE:  Suspended microdirect laryngoscopy with PROLARYN injections.  SURGEON:  Onnie Graham, MD.  ANESTHESIA:  General Jet Venturi ventilation.  COMPLICATIONS:  None.  INDICATIONS:  The patient is an 81 year old female, who has a year history of hoarseness that is worse with more talking and resultant vocal fatigue.  She was found to have bowed vocal fold and presents to the operating room for surgical management.  FINDINGS:  The vocal folds appeared normal except for a bowed appearance.  0.25 mL of PROLARYN was injected in each vocal folds 0.15 more posteriorly and 0.1 more anteriorly.  DESCRIPTION OF PROCEDURE:  The patient was identified in the holding room.  Informed consent having been obtained discussion of risks, benefits, alternatives, the patient was brought to the operative suite and put on operative table in supine position.  Anesthesia was induced. The patient maintained via mask ventilation.  The eyes taped and closed and the patient was given intravenous Decadron during the case.  The bed was turned 90 degrees from anesthesia and bite guard was placed.  A Storz laryngoscope was then inserted into the supraglottic position and suspended to the Mayo stand using a Lewy arm.  Jet Venturi ventilation was then initiated and maintained through the case.  A 0-degree telescope was used to make a preoperative photograph.  PROLARYN was then injected in each vocal fold starting more posteriorly just anterior to the vocal process on the  right side and then going to the left side and then injecting 0.15 mL in each side.  A second injection was then placed in each side more anteriorly on the right and then on the left.  All injections were kept lateral and away from the knee striking zone.  This resulted in good medialization of vocal folds.  Jet Venturi ventilation was held during injections.  There was some bleeding that resulted, so epinephrine-soaked pledget was held against the glottis twice to help control bleeding while the Jet Venturi ventilation was held.  After this was completed and bleeding was stopped, a  postoperative photograph was taken.  The air was suctioned and the vocal folds were sprayed with topical lidocaine.  The laryngoscope was then taken out of suspension and removed from the patient's mouth while suctioning the airway.  She returned to mask ventilation and was turned back to Anesthesia for wake-up.  She was moved to the Recovery Room in stable condition.     Onnie Graham, MD     DDB/MEDQ  D:  04/30/2014  T:  05/01/2014  Job:  [id number]

## 2014-05-22 DIAGNOSIS — N390 Urinary tract infection, site not specified: Secondary | ICD-10-CM | POA: Diagnosis not present

## 2014-05-22 DIAGNOSIS — R339 Retention of urine, unspecified: Secondary | ICD-10-CM | POA: Diagnosis not present

## 2014-07-12 ENCOUNTER — Encounter: Payer: Self-pay | Admitting: Family Medicine

## 2014-07-12 ENCOUNTER — Ambulatory Visit (INDEPENDENT_AMBULATORY_CARE_PROVIDER_SITE_OTHER): Payer: Medicare Other | Admitting: Family Medicine

## 2014-07-12 VITALS — BP 100/72 | HR 85 | Temp 97.7°F | Ht 64.0 in | Wt 159.3 lb

## 2014-07-12 DIAGNOSIS — E538 Deficiency of other specified B group vitamins: Secondary | ICD-10-CM | POA: Diagnosis not present

## 2014-07-12 DIAGNOSIS — E785 Hyperlipidemia, unspecified: Secondary | ICD-10-CM

## 2014-07-12 DIAGNOSIS — E2839 Other primary ovarian failure: Secondary | ICD-10-CM | POA: Diagnosis not present

## 2014-07-12 DIAGNOSIS — R5383 Other fatigue: Secondary | ICD-10-CM | POA: Diagnosis not present

## 2014-07-12 DIAGNOSIS — R490 Dysphonia: Secondary | ICD-10-CM

## 2014-07-12 DIAGNOSIS — R739 Hyperglycemia, unspecified: Secondary | ICD-10-CM

## 2014-07-12 DIAGNOSIS — Z0000 Encounter for general adult medical examination without abnormal findings: Secondary | ICD-10-CM | POA: Diagnosis not present

## 2014-07-12 LAB — VITAMIN B12: VITAMIN B 12: 734 pg/mL (ref 211–911)

## 2014-07-12 LAB — LIPID PANEL
Cholesterol: 235 mg/dL — ABNORMAL HIGH (ref 0–200)
HDL: 54.1 mg/dL (ref >39.00)
LDL Cholesterol: 163 mg/dL — ABNORMAL HIGH (ref 0–99)
NONHDL: 180.9
Total CHOL/HDL Ratio: 4
Triglycerides: 89 mg/dL (ref 0.0–149.0)
VLDL: 17.8 mg/dL (ref 0.0–40.0)

## 2014-07-12 LAB — HEMOGLOBIN A1C: HEMOGLOBIN A1C: 5.6 % (ref 4.6–6.5)

## 2014-07-12 LAB — TSH: TSH: 2.23 u[IU]/mL (ref 0.35–4.50)

## 2014-07-12 NOTE — Progress Notes (Signed)
Medicare Annual Preventive Care Visit  (initial annual wellness or annual wellness exam)  Concerns and/or follow up today - chronic mild change in vocal range/hoarsness - sings at church. Sees ENT for this. She is very bothered by this. She wants to check a thyroid level. Has some chronic mild fatigue. Denies changes in weight, dysphagia, bowels or skin. She also wants to check her B12 levels as reports hx of low b12 requiring injections. She take a b12 vitamin daily now - not sublingual.  ROS: negative for report of fevers, unintentional weight loss, vision changes, vision loss, hearing loss or change, chest pain, sob, hemoptysis, melena, hematochezia, hematuria, genital discharge or lesions, falls, bleeding or bruising, loc, thoughts of suicide or self harm, memory loss  1.) Patient-completed health risk assessment  - completed and reviewed, see scanned documentation  2.) Review of Medical History: -PMH, PSH, Family History and current specialty and care providers reviewed - see scanned in document in chart and below  Past Medical History  Diagnosis Date  . Vitamin D deficiency   . Vitamin B deficiency   . DVT (deep venous thrombosis)     left lower extremity  . Heterozygous factor V Leiden mutation   . Heart murmur     was told years ago she had a murmur but no one mentions it now.  . Frequent UTI     hx of   . GERD (gastroesophageal reflux disease)   . History of hiatal hernia   . Arthritis   . Cancer     skin cancer (removed)  . Blood dyscrasia     low platelets  . Intermittent self-catheterization of bladder     chronic urinary retention, sees urologist    Past Surgical History  Procedure Laterality Date  . Bunionectomy Bilateral   . Hernia repair    . Cataract extraction Left 1999    with lens implant  . Cholecystectomy    . Appendectomy    . Tonsillectomy    . Abdominal hysterectomy    . Joint replacement Right 2007    hip  . Colonoscopy w/ polypectomy    .  Microlaryngoscopy w/vocal cord injection N/A 04/30/2014    Procedure: MICROLARYNGOSCOPY WITH VOCAL CORD INJECTION;  Surgeon: Melida Quitter, MD;  Location: [facility];  Service: ENT;  Laterality: N/A;  Micro Direct Laryngoscopy with Prolaryn injection/jet ventilation    History   Social History  . Marital Status: Widowed    Spouse Name: N/A  . Number of Children: N/A  . Years of Education: N/A   Occupational History  . Not on file.   Social History Main Topics  . Smoking status: Never Smoker   . Smokeless tobacco: Never Used  . Alcohol Use: Yes     Comment: glass on a rare occ  . Drug Use: No  . Sexual Activity: Not on file   Other Topics Concern  . Not on file   Social History Narrative   Work or School: volunteers with music group      Home Situation: lives [facility] [place] [facility]      Spiritual Beliefs: Baptist, religious      Lifestyle: active at residence, walking once per week, diet is ok             The patient has a family history of  3.) Review of functional ability and level of safety:  Any difficulty hearing? NO  History of falling? NO  Any trouble with IADLs - using a  phone, using transportation, grocery shopping, preparing meals, doing housework, doing laundry, taking medications and managing money? NO  Advance Directives? YES  See summary of recommendations in Patient Instructions below.  4.) Physical Exam Filed Vitals:   07/12/14 0825  BP: 100/72  Pulse: 85  Temp: 97.7 F (36.5 C)   Estimated body mass index is 27.33 kg/(m^2) as calculated from the following:   Height as of this encounter: 5\' 4"  (1.626 m).   Weight as of this encounter: 159 lb 4.8 oz (72.258 kg).  EKG (optional): deferred  General: alert, appear well hydrated and in no acute distress  HEENT: visual acuity grossly intact  CV: HRRR, no lower ext edema  Neck: no masses or abnormalities noted  Lungs: CTA bilaterally  Psych: pleasant and cooperative, no obvious  depression or anxiety  NEURO: gait normal. Speech, hearing and thought processing grossly intact.  Cognitive function grossly intact  See patient instructions for recommendations.  Education and counseling regarding the above review of health provided with a plan for the following: -see scanned patient completed form for further details -fall prevention strategies discussed  -healthy lifestyle discussed -importance and resources for completing advanced directives discussed -see patient instructions below for any other recommendations provided  4)The following written screening schedule of preventive measures were reviewed with assessment and plan made per below, orders and patient instructions:          7.) Summary:   Medicare annual wellness visit, subsequent -risk factors and conditions per above assessment were discussed and treatment, recommendations and referrals were offered per documentation above and orders and patient instructions.  Estrogen deficiency - Plan: DG Bone Density. Discussed treatment options if has osteoporosis.  Other fatigue - Plan: TSH Hoarseness - Plan: TSH Discussed tx options if abnormal. She has bowed vocal cords and is seeing ETN and this is likely the cause of her vocal issues. No appreciable hoarseness on my exam and normal eval of neck. Advised she follow up with ENT.  Hyperlipemia - Plan: Lipid panel  Hyperglycemia - Plan: Hemoglobin A1c  B12 deficiency - Plan: Vitamin B12. Advised sublingual B12 if low and discussed safe options for supplementation.  Patient Instructions  BEFORE YOU LEAVE: -labs -schedule follow up in 6 months  -We placed a referral for you as discussed for the bone density test. It usually takes about 1-2 weeks to process and schedule this referral. If you have not heard from [place] regarding this appointment in 2 weeks please contact our office.  We recommend the following healthy lifestyle measures: - eat a healthy diet  consisting of lots of vegetables, fruits, beans, nuts, seeds, healthy meats such as white chicken and fish and whole grains.  - avoid fried foods, fast food, processed foods, sodas, red meet and other fattening foods.  - get a least 150 minutes of aerobic exercise per week.       Alcohol screening - done     Obesity Screening and counseling - done     STI screening (Hep C if born [birth year]) - done     Tobacco Screening - done       Pneumococcal (PPSV23 -one dose after 74, one before if risk factors), influenza yearly and hepatitis B vaccines (if high risk - end stage renal disease, IV drugs, homosexual men, live in home for mentally retarded, hemophilia receiving factors) ASSESSMENT/PLAN: done      Screening mammograph (yearly if >40) ASSESSMENT/PLAN: done 02/2014      Screening Pap smear/pelvic exam (q2  years) ASSESSMENT/PLAN: n/a      Colorectal cancer screening (FOBT yearly or flex sig q4y or colonoscopy q10y or barium enema q4y) ASSESSMENT/PLAN: done 2014      Diabetes outpatient self-management training services ASSESSMENT/PLAN: n/a      Bone mass measurements(covered q2y if indicated - estrogen def, osteoporosis, hyperparathyroid, vertebral abnormalities, osteoporosis or steroids) ASSESSMENT/PLAN: done and offered 03/2014; she agrees to do today. DEXA ordered today.      Screening for glaucoma(q1y if high risk - diabetes, FH, AA and > 50 or hispanic and > 65) ASSESSMENT/PLAN: n/a      Medical nutritional therapy for individuals with diabetes or renal disease ASSESSMENT/PLAN: n/a      Cardiovascular screening blood tests (lipids q5y) ASSESSMENT/PLAN: today      Diabetes screening tests ASSESSMENT/PLAN: today

## 2014-07-12 NOTE — Progress Notes (Signed)
Pre visit review using our clinic review tool, if applicable. No additional management support is needed unless otherwise documented below in the visit note. 

## 2014-07-12 NOTE — Patient Instructions (Signed)
BEFORE YOU LEAVE: -labs -schedule follow up in 6 months  -We placed a referral for you as discussed for the bone density test. It usually takes about 1-2 weeks to process and schedule this referral. If you have not heard from [place] regarding this appointment in 2 weeks please contact our office.  We recommend the following healthy lifestyle measures: - eat a healthy diet consisting of lots of vegetables, fruits, beans, nuts, seeds, healthy meats such as white chicken and fish and whole grains.  - avoid fried foods, fast food, processed foods, sodas, red meet and other fattening foods.  - get a least 150 minutes of aerobic exercise per week.       Alcohol screening - done     Obesity Screening and counseling - done     STI screening (Hep C if born [birth year]) - done     Tobacco Screening - done       Pneumococcal (PPSV23 -one dose after 64, one before if risk factors), influenza yearly and hepatitis B vaccines (if high risk - end stage renal disease, IV drugs, homosexual men, live in home for mentally retarded, hemophilia receiving factors) ASSESSMENT/PLAN: done      Screening mammograph (yearly if >40) ASSESSMENT/PLAN: done 02/2014      Screening Pap smear/pelvic exam (q2 years) ASSESSMENT/PLAN: n/a      Colorectal cancer screening (FOBT yearly or flex sig q4y or colonoscopy q10y or barium enema q4y) ASSESSMENT/PLAN: done 2014      Diabetes outpatient self-management training services ASSESSMENT/PLAN: n/a      Bone mass measurements(covered q2y if indicated - estrogen def, osteoporosis, hyperparathyroid, vertebral abnormalities, osteoporosis or steroids) ASSESSMENT/PLAN: done and offered 03/2014; she agrees to do today. DEXA ordered today.      Screening for glaucoma(q1y if high risk - diabetes, FH, AA and > 50 or hispanic and > 65) ASSESSMENT/PLAN: n/a      Medical nutritional therapy for individuals with diabetes or renal disease ASSESSMENT/PLAN: n/a      Cardiovascular screening  blood tests (lipids q5y) ASSESSMENT/PLAN: today      Diabetes screening tests ASSESSMENT/PLAN: today

## 2014-08-02 ENCOUNTER — Ambulatory Visit (INDEPENDENT_AMBULATORY_CARE_PROVIDER_SITE_OTHER)
Admission: RE | Admit: 2014-08-02 | Discharge: 2014-08-02 | Disposition: A | Discharge status: Home / Self Care | Payer: Medicare Other | Source: Ambulatory Visit | Attending: Family Medicine | Admitting: Family Medicine

## 2014-08-02 DIAGNOSIS — E2839 Other primary ovarian failure: Secondary | ICD-10-CM | POA: Diagnosis not present

## 2014-10-16 ENCOUNTER — Encounter: Payer: Self-pay | Admitting: Family Medicine

## 2014-10-16 ENCOUNTER — Ambulatory Visit (INDEPENDENT_AMBULATORY_CARE_PROVIDER_SITE_OTHER): Payer: Medicare Other | Admitting: Family Medicine

## 2014-10-16 VITALS — BP 128/80 | HR 73 | Temp 97.9°F | Ht 64.0 in | Wt 158.5 lb

## 2014-10-16 DIAGNOSIS — Z23 Encounter for immunization: Secondary | ICD-10-CM

## 2014-10-16 DIAGNOSIS — R42 Dizziness and giddiness: Secondary | ICD-10-CM | POA: Diagnosis not present

## 2014-10-16 NOTE — Progress Notes (Signed)
Pre visit review using our clinic review tool, if applicable. No additional management support is needed unless otherwise documented below in the visit note. 

## 2014-10-16 NOTE — Patient Instructions (Signed)
BEFORE YOU LEAVE: -flu vaccine -pneumococcal 24 -follow up in 3-4 months  FOR THE VERTIGO: -meclizine as needed for mild symptoms -call if not resolved in one week and will do vestibular rehab -if worsening, new symptoms or persists in 3-4 week please return to clinic  Benign Positional Vertigo Vertigo means you feel like you or your surroundings are moving when they are not. Benign positional vertigo is the most common form of vertigo. Benign means that the cause of your condition is not serious. Benign positional vertigo is more common in older adults. CAUSES  Benign positional vertigo is the result of an upset in the labyrinth system. This is an area in the middle ear that helps control your balance. This may be caused by a viral infection, head injury, or repetitive motion. However, often no specific cause is found. SYMPTOMS  Symptoms of benign positional vertigo occur when you move your head or eyes in different directions. Some of the symptoms may include:  Loss of balance and falls.  Vomiting.  Blurred vision.  Dizziness.  Nausea.  Involuntary eye movements (nystagmus). DIAGNOSIS  Benign positional vertigo is usually diagnosed by physical exam. If the specific cause of your benign positional vertigo is unknown, your caregiver may perform imaging tests, such as magnetic resonance imaging (MRI) or computed tomography (CT). TREATMENT  Your caregiver may recommend movements or procedures to correct the benign positional vertigo. Medicines such as meclizine, benzodiazepines, and medicines for nausea may be used to treat your symptoms. In rare cases, if your symptoms are caused by certain conditions that affect the inner ear, you may need surgery. HOME CARE INSTRUCTIONS   Follow your caregiver's instructions.  Move slowly. Do not make sudden body or head movements.  Avoid driving.  Avoid operating heavy machinery.  Avoid performing any tasks that would be dangerous to you  or others during a vertigo episode.  Drink enough fluids to keep your urine clear or pale yellow. SEEK IMMEDIATE MEDICAL CARE IF:   You develop problems with walking, weakness, numbness, or using your arms, hands, or legs.  You have difficulty speaking.  You develop severe headaches.  Your nausea or vomiting continues or gets worse.  You develop visual changes.  Your family or friends notice any behavioral changes.  Your condition gets worse.  You have a fever.  You develop a stiff neck or sensitivity to light. MAKE SURE YOU:   Understand these instructions.  Will watch your condition.  Will get help right away if you are not doing well or get worse. Document Released: 10/13/2005 Document Revised: 03/30/2011 Document Reviewed: 09/25/2010 [facility] Patient Information 2015 [place], [place]. This information is not intended to replace advice given to you by your health care provider. Make sure you discuss any questions you have with your health care provider.

## 2014-10-16 NOTE — Addendum Note (Signed)
Addended by: Agnes Lawrence on: 10/16/2014 09:46 AM   Modules accepted: Orders

## 2014-10-16 NOTE — Progress Notes (Signed)
HPI:  Acute visit for:  Positional Vertigo: -reports started about 10 years ago and recurs infrequently -mild flare the last few days, now better today -symptoms: mild, brief episodes of vertigo triggered by quick movements of the head particularly to the R -denies: cp, sob, doe, vision changes, ha, weakness, numbness, issues with speech, fevers, hearing or memory loss, vomiting, nausea -reports take meclizine otc and this resolves symptoms  ROS: See pertinent positives and negatives per HPI.  Past Medical History  Diagnosis Date  . Vitamin D deficiency   . Vitamin B deficiency   . DVT (deep venous thrombosis)     left lower extremity  . Heterozygous factor V Leiden mutation   . Heart murmur     was told years ago she had a murmur but no one mentions it now.  . Frequent UTI     hx of   . GERD (gastroesophageal reflux disease)   . History of hiatal hernia   . Arthritis   . Cancer     skin cancer (removed)  . Blood dyscrasia     low platelets  . Intermittent self-catheterization of bladder     chronic urinary retention, sees urologist    Past Surgical History  Procedure Laterality Date  . Bunionectomy Bilateral   . Hernia repair    . Cataract extraction Left 1999    with lens implant  . Cholecystectomy    . Appendectomy    . Tonsillectomy    . Abdominal hysterectomy    . Joint replacement Right 2007    hip  . Colonoscopy w/ polypectomy    . Microlaryngoscopy w/vocal cord injection N/A 04/30/2014    Procedure: MICROLARYNGOSCOPY WITH VOCAL CORD INJECTION;  Surgeon: Melida Quitter, MD;  Location: [facility];  Service: ENT;  Laterality: N/A;  Micro Direct Laryngoscopy with Prolaryn injection/jet ventilation    Family History  Problem Relation Age of Onset  . Heart disease Mother     chf  . Cancer Father     died at age 31    Social History   Social History  . Marital Status: Widowed    Spouse Name: N/A  . Number of Children: N/A  . Years of Education: N/A    Social History Main Topics  . Smoking status: Never Smoker   . Smokeless tobacco: Never Used  . Alcohol Use: Yes     Comment: glass on a rare occ  . Drug Use: No  . Sexual Activity: Not Asked   Other Topics Concern  . None   Social History Narrative   Work or School: volunteers with music group      Home Situation: lives in [place], religious      Lifestyle: active at residence, walking once per week, diet is ok              Current outpatient prescriptions:  .  acetaminophen (TYLENOL) 500 MG tablet, Take 500 mg by mouth every 6 (six) hours as needed (foot pain)., Disp: , Rfl:  .  aspirin EC 81 MG tablet, Take 81 mg by mouth daily., Disp: , Rfl:  .  B Complex Vitamins (B COMPLEX PO), Take 1 tablet by mouth daily. , Disp: , Rfl:  .  Cholecalciferol (VITAMIN D PO), 2,000 Units daily. , Disp: , Rfl:  .  Flaxseed, Linseed, (FLAX SEED OIL PO), Take by mouth., Disp: , Rfl:  .  Loratadine (ALAVERT PO), Take by mouth.,  Disp: , Rfl:  .  meclizine (ANTIVERT) 25 MG tablet, Take 25 mg by mouth as needed (vertigo)., Disp: , Rfl:   EXAM:  Filed Vitals:   10/16/14 0904  BP: 128/80  Pulse: 73  Temp: 97.9 F (36.6 C)    Body mass index is 27.19 kg/(m^2).  GENERAL: vitals reviewed and listed above, alert, oriented, appears well hydrated and in no acute distress  HEENT: atraumatic, conjunttiva clear, PERRLA, visual acuity grossly intact,no obvious abnormalities on inspection of external nose and ears, ear canals and TMs normal  NECK: no obvious masses on inspection, no bruit  LUNGS: clear to auscultation bilaterally, no wheezes, rales or rhonchi, good air movement  CV: HRRR, no peripheral edema  MS: moves all extremities without noticeable abnormality  PSYCH: pleasant and cooperative, no obvious depression or anxiety  NEURO: CN II-XII grossly intact, finger to nose normal, speech and thought processing grossly normal, gait  normal, dix hallpike to right cause her some mild symptoms - o/w normal  ASSESSMENT AND PLAN:  Discussed the following assessment and plan:  Vertigo -we discussed possible serious and likely etiologies, workup and treatment, treatment risks and return precautions with BPPV most likely -after this discussion, Diane Roach opted for observation and meclizine as is doing better, vestibular rehab if persists, further eval for other causes if persists despite tx -of course, we advised Ryn  to return or notify a doctor immediately if symptoms worsen or persist or new concerns arise.  -Patient advised to return or notify a doctor immediately if symptoms worsen or persist or new concerns arise.  Patient Instructions  BEFORE YOU LEAVE: -flu vaccine -pneumococcal 24 -follow up in 3-4 months  FOR THE VERTIGO: -meclizine as needed for mild symptoms -call if not resolved in one week and will do vestibular rehab -if worsening, new symptoms or persists in 3-4 week please return to clinic  Benign Positional Vertigo Vertigo means you feel like you or your surroundings are moving when they are not. Benign positional vertigo is the most common form of vertigo. Benign means that the cause of your condition is not serious. Benign positional vertigo is more common in older adults. CAUSES  Benign positional vertigo is the result of an upset in the labyrinth system. This is an area in the middle ear that helps control your balance. This may be caused by a viral infection, head injury, or repetitive motion. However, often no specific cause is found. SYMPTOMS  Symptoms of benign positional vertigo occur when you move your head or eyes in different directions. Some of the symptoms may include:  Loss of balance and falls.  Vomiting.  Blurred vision.  Dizziness.  Nausea.  Involuntary eye movements (nystagmus). DIAGNOSIS  Benign positional vertigo is usually diagnosed by physical exam. If the specific  cause of your benign positional vertigo is unknown, your caregiver may perform imaging tests, such as magnetic resonance imaging (MRI) or computed tomography (CT). TREATMENT  Your caregiver may recommend movements or procedures to correct the benign positional vertigo. Medicines such as meclizine, benzodiazepines, and medicines for nausea may be used to treat your symptoms. In rare cases, if your symptoms are caused by certain conditions that affect the inner ear, you may need surgery. HOME CARE INSTRUCTIONS   Follow your caregiver's instructions.  Move slowly. Do not make sudden body or head movements.  Avoid driving.  Avoid operating heavy machinery.  Avoid performing any tasks that would be dangerous to you or others during a vertigo episode.  Drink  enough fluids to keep your urine clear or pale yellow. SEEK IMMEDIATE MEDICAL CARE IF:   You develop problems with walking, weakness, numbness, or using your arms, hands, or legs.  You have difficulty speaking.  You develop severe headaches.  Your nausea or vomiting continues or gets worse.  You develop visual changes.  Your family or friends notice any behavioral changes.  Your condition gets worse.  You have a fever.  You develop a stiff neck or sensitivity to light. MAKE SURE YOU:   Understand these instructions.  Will watch your condition.  Will get help right away if you are not doing well or get worse. Document Released: 10/13/2005 Document Revised: 03/30/2011 Document Reviewed: 09/25/2010 [facility] Patient Information 2015 [place], [place]. This information is not intended to replace advice given to you by your health care Marlaina Coburn. Make sure you discuss any questions you have with your health care Kirsta Probert.      Colin Benton R.

## 2014-10-25 ENCOUNTER — Telehealth: Payer: Self-pay | Admitting: Family Medicine

## 2014-10-25 DIAGNOSIS — H8111 Benign paroxysmal vertigo, right ear: Secondary | ICD-10-CM

## 2014-10-25 NOTE — Telephone Encounter (Signed)
Patient calling to report vertigo symptoms have not improved since visit with Dr. Maudie Mercury.  Patient requesting order for vestibular rehab.

## 2014-10-25 NOTE — Telephone Encounter (Signed)
Referral placed. Advise she follow up with me if this does not help or as needed.

## 2014-10-26 NOTE — Telephone Encounter (Signed)
I called the pt and informed her of the message below

## 2014-11-01 ENCOUNTER — Ambulatory Visit: Payer: Medicare Other | Attending: Family Medicine

## 2014-11-01 DIAGNOSIS — R2681 Unsteadiness on feet: Secondary | ICD-10-CM | POA: Diagnosis not present

## 2014-11-01 DIAGNOSIS — R42 Dizziness and giddiness: Secondary | ICD-10-CM | POA: Diagnosis not present

## 2014-11-01 DIAGNOSIS — R269 Unspecified abnormalities of gait and mobility: Secondary | ICD-10-CM | POA: Diagnosis not present

## 2014-11-01 NOTE — Therapy (Signed)
[facility] [address] [place], [place], [address] Phone: [phone]   Fax:  [phone]  Physical Therapy Evaluation  Patient Details  Name: Diane Roach MRN: [id number] Date of Birth: 1930/07/12 Referring Provider:  Lucretia Kern, DO  Encounter Date: 11/01/2014      PT End of Session - 11/01/14 1521    Visit Number 1   Number of Visits 9   Date for PT Re-Evaluation 12/01/14   Authorization Type G-code every 10th visit.   PT Start Time 1102   PT Stop Time 1153   PT Time Calculation (min) 51 min   Equipment Utilized During Treatment Gait belt   Activity Tolerance Patient tolerated treatment well   Behavior During Therapy WFL for tasks assessed/performed      Past Medical History  Diagnosis Date  . Vitamin D deficiency   . Vitamin B deficiency   . DVT (deep venous thrombosis) ([facility])     left lower extremity  . Heterozygous factor V Leiden mutation ([place])   . Heart murmur     was told years ago she had a murmur but no one mentions it now.  . Frequent UTI     hx of   . GERD (gastroesophageal reflux disease)   . History of hiatal hernia   . Arthritis   . Cancer ([place])     skin cancer (removed)  . Blood dyscrasia     low platelets  . Intermittent self-catheterization of bladder [facility])     chronic urinary retention, sees urologist    Past Surgical History  Procedure Laterality Date  . Bunionectomy Bilateral   . Hernia repair    . Cataract extraction Left 1999    with lens implant  . Cholecystectomy    . Appendectomy    . Tonsillectomy    . Abdominal hysterectomy    . Joint replacement Right 2007    hip  . Colonoscopy w/ polypectomy    . Microlaryngoscopy w/vocal cord injection N/A 04/30/2014    Procedure: MICROLARYNGOSCOPY WITH VOCAL CORD INJECTION;  Surgeon: Melida Quitter, MD;  Location: [facility];  Service: ENT;  Laterality: N/A;  Micro Direct Laryngoscopy with Prolaryn injection/jet ventilation     There were no vitals filed for this visit.  Visit Diagnosis:  Dizziness and giddiness - Plan: PT plan of care cert/re-cert  Abnormality of gait - Plan: PT plan of care cert/re-cert  Unsteadiness - Plan: PT plan of care cert/re-cert      Subjective Assessment - 11/01/14 1111    Subjective Pt reported she has experienced severe vertigo (room spinning and falling down) on and off for years. This time the dizziness began approx. a few weeks ago, and it is moderate. Pt reports she does not feel like the room is spinning and it feels different than previous episodes. She feels fine sitting but feels heaviness/pressure in the back of her head, and tipsy/wobbly when walking. 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 Pt denied falls.  Pt enjoys exercising with neighbors at their apartment gym.   Pertinent History DVT, L cataract removed but has R cataract   Patient Stated Goals Eliminate imbalance   Currently in Pain? No/denies  but does have neck pain with cx rotation intermittently.            [facility] PT Assessment - 11/01/14 1116    Assessment   Medical Diagnosis BPPV    Onset Date/Surgical Date 10/18/14  Prior Therapy none   Precautions   Precautions Fall   Precaution Comments based on DGI score   Restrictions   Weight Bearing Restrictions No   Balance Screen   Has the patient fallen in the past 6 months No   Has the patient had a decrease in activity level because of a fear of falling?  Yes  decrease walking distance   Is the patient reluctant to leave their home because of a fear of falling?  No   Home Environment   Living Environment Private residence   Living Arrangements Alone   Available Help at Discharge Family   Type of [place]  there is a person at the desk 24/7 if help is needed   Home Access Level entry   [place] Other (comment);Grab bars - tub/shower;Shower seat - built in  walking  stick, for longer distances   Prior Function   Level of Independence Independent   Vocation Retired   Leisure Gym at the apartment, walking, read/writing, travel    Cognition   Overall Cognitive Status Within Functional Limits for tasks assessed  but pt reported issues with remembering names   Ambulation/Gait   Ambulation/Gait Yes   Ambulation/Gait Assistance 5: Supervision   Ambulation/Gait Assistance Details Pt noted to amb. in guarded manner, especially during head turns.   Ambulation Distance (Feet) 100 Feet   Assistive device None   Gait Pattern Step-through pattern;Decreased stride length;Decreased trunk rotation   Ambulation Surface Level;Indoor   Gait velocity 3.88ft/sec.  no AD   Standardized Balance Assessment   Standardized Balance Assessment Timed Up and Go Test;Dynamic Gait Index   Dynamic Gait Index   Level Surface Normal   Change in Gait Speed Normal   Gait with Horizontal Head Turns Mild Impairment   Gait with Vertical Head Turns Mild Impairment   Gait and Pivot Turn Mild Impairment   Step Over Obstacle Mild Impairment   Step Around Obstacles Mild Impairment   Steps Mild Impairment   Total Score 18   DGI comment: Pt reported she felt a little fatigued after DGI.    Timed Up and Go Test   TUG Normal TUG   Normal TUG (seconds) 11.7  no AD            Vestibular Assessment - 11/01/14 1133    Vestibular Assessment   General Observation Pt reports dizziness does go away (8/52) but during certain activities it increases (4-5/10)   Symptom Behavior   Type of Dizziness Imbalance  no spinning   Frequency of Dizziness Every day   Duration of Dizziness all the time when walking, decreases with rest   Aggravating Factors Turning head quickly;Turning head sideways  walking   Relieving Factors Rest;Slow movements   Occulomotor Exam   Occulomotor Alignment Normal   Spontaneous Absent   Gaze-induced Absent   Smooth Pursuits Intact   Saccades Slow;Poor  trajectory  L side and R side was WNL   Comment Pt felt like she had to work hard looking to the L side.    Vestibulo-Occular Reflex   VOR 1 Head Only (x 1 viewing) WNL.   Comment During R head thrust pt had corrective saccades and reported "feels like a shaking in my head".   Positional Testing   Dix-Hallpike Dix-Hallpike Right;Dix-Hallpike Left   Horizontal Canal Testing Horizontal Canal Right;Horizontal Canal Left   Dix-Hallpike Right   Dix-Hallpike Right Duration no symptoms   Dix-Hallpike Right Symptoms No  nystagmus   Dix-Hallpike Left   Dix-Hallpike Left Duration no symptoms   Dix-Hallpike Left Symptoms No nystagmus   Horizontal Canal Right   Horizontal Canal Right Duration no symptoms or dizziness   Horizontal Canal Right Symptoms Normal   Horizontal Canal Left   Horizontal Canal Left Duration no symptoms or dizziness.   Horizontal Canal Left Symptoms Normal   Positional Sensitivities   Supine to Sitting Moderate dizziness                       PT Education - November 30, 2014 1521    Education provided Yes   Education Details PT duration/frequency. Results of vestibular testing and outcome measures.   Person(s) Educated Patient   Methods Explanation   Comprehension Verbalized understanding          PT Short Term Goals - 30-Nov-2014 1622    PT SHORT TERM GOAL #1   Title same as LTGs.           PT Long Term Goals - 11-30-2014 1622    PT LONG TERM GOAL #1   Title Pt will be IND in HEP to decr. dizziness, improve balance and safety during amb. Target date: 11/29/14.   Status New   PT LONG TERM GOAL #2   Title Pt will improve DGI from 18/24 to >/=20/24 to decr. falls risk. Target date: 11/29/14.   Status New   PT LONG TERM GOAL #3   Title Pt will amb. 600' over even/uneven terrain, while performing head turns, at MOD I level and no AD to improve functional mobillity. Target date: 11/29/14.   Status New   PT LONG TERM GOAL #4   Title Pt will report  dizziness at its worse is 2/10 vs. 4-5/10 in order to amb. safely and perform ADLs safey. Target date: 11/29/14.   Status New               Plan - 11-30-14 1522    Clinical Impression Statement Pt is a pleasant 81y/o female presenting to OPPT neuro with dizziness. B Dix-Hallpike and B roll testing all negative, but PT will continue to retest for vertigo if pt reports symptoms similar to BPPV. Pt presented with dizziness during saccades  and R head thrust, she also experienced dizziness while ambulating and performing head turns. Pt also had incr. dizziness during supine to sit. Pt's symptoms indicate pt is experiencing vestibular hypotfunction. PT will perform SOT next session and orthostatics next session in order to determine if dizziness could be related to hypofunctioning vestibular system and/or decreased BP.    Pt will benefit from skilled therapeutic intervention in order to improve on the following deficits Abnormal gait;Decreased endurance;Postural dysfunction;Impaired flexibility;Decreased range of motion;Dizziness;Decreased mobility;Decreased balance;Decreased knowledge of use of DME   Rehab Potential Good   PT Frequency 2x / week   PT Duration 4 weeks   PT Treatment/Interventions ADLs/Self Care Home Management;Neuromuscular re-education;Biofeedback;Canalith Repostioning;Vestibular;Manual techniques;Balance training;Therapeutic exercise;Therapeutic activities;Functional mobility training;Gait training;Stair training;Patient/family education;DME Instruction   PT Next Visit Plan Perform SOT and orthostatics.  Initiate gaze stabilization and balance HEP.   Consulted and Agree with Plan of Care Patient          G-Codes - 11/30/2014 1626    Functional Assessment Tool Used DGI: 18/24, dizziness at its worse: 4-5/10, TUG no AD: 11.7 sec.; gait speed no AD: 3.56ft/sec.   Functional Limitation Mobility: Walking and moving around   Mobility: Walking and Moving Around Current Status  [phone]) At least  20 percent but less than 40 percent impaired, limited or restricted   Mobility: Walking and Moving Around Goal Status [phone]) At least 1 percent but less than 20 percent impaired, limited or restricted       Problem List Patient Active Problem List   Diagnosis Date Noted  . DVT (deep venous thrombosis) ([place]) 10/12/2013  . Arthritis, senescent 10/12/2013  . Urinary retention 10/12/2013  . Heterozygous factor V Leiden mutation ([place]) 10/12/2013  . FH: pulmonary embolism 10/12/2013    Aayden Cefalu L 11/01/2014, 4:28 PM  [facility] [address] [place], [place], [address] Phone: [phone]   Fax:  [phone]    Geoffry Paradise, PT,DPT 11/01/2014 4:28 PM Phone: [phone] Fax: [phone]

## 2014-11-06 ENCOUNTER — Ambulatory Visit: Payer: Medicare Other

## 2014-11-06 DIAGNOSIS — R42 Dizziness and giddiness: Secondary | ICD-10-CM

## 2014-11-06 DIAGNOSIS — R269 Unspecified abnormalities of gait and mobility: Secondary | ICD-10-CM | POA: Diagnosis not present

## 2014-11-06 DIAGNOSIS — R2681 Unsteadiness on feet: Secondary | ICD-10-CM

## 2014-11-06 NOTE — Therapy (Signed)
[facility] [address] [place], [place], [address] Phone: [phone]   Fax:  [phone]  Physical Therapy Treatment  Patient Details  Name: Diane Roach MRN: [id number] Date of Birth: [birth year] No Data Recorded  Encounter Date: 11/06/2014      PT End of Session - 11/06/14 1313    Visit Number 2   Number of Visits 9   Date for PT Re-Evaluation 12/01/14   Authorization Type G-code every 10th visit.   PT Start Time 0932   PT Stop Time 1013   PT Time Calculation (min) 41 min   Activity Tolerance Patient tolerated treatment well   Behavior During Therapy WFL for tasks assessed/performed      Past Medical History  Diagnosis Date  . Vitamin D deficiency   . Vitamin B deficiency   . DVT (deep venous thrombosis) (HCC)     left lower extremity  . Heterozygous factor V Leiden mutation ([facility])   . Heart murmur     was told years ago she had a murmur but no one mentions it now.  . Frequent UTI     hx of   . GERD (gastroesophageal reflux disease)   . History of hiatal hernia   . Arthritis   . Cancer ([facility])     skin cancer (removed)  . Blood dyscrasia     low platelets  . Intermittent self-catheterization of bladder [facility])     chronic urinary retention, sees urologist    Past Surgical History  Procedure Laterality Date  . Bunionectomy Bilateral   . Hernia repair    . Cataract extraction Left 1999    with lens implant  . Cholecystectomy    . Appendectomy    . Tonsillectomy    . Abdominal hysterectomy    . Joint replacement Right 2007    hip  . Colonoscopy w/ polypectomy    . Microlaryngoscopy w/vocal cord injection N/A 04/30/2014    Procedure: MICROLARYNGOSCOPY WITH VOCAL CORD INJECTION;  Surgeon: Melida Quitter, MD;  Location: [facility];  Service: ENT;  Laterality: N/A;  Micro Direct Laryngoscopy with Prolaryn injection/jet ventilation    There were no vitals filed for this visit.  Visit Diagnosis:   Dizziness and giddiness  Unsteadiness      Subjective Assessment - 11/06/14 0934    Subjective Pt denied dizziness this morning but feels heaviness in her head. Pt denied falls since last visit.   Pertinent History DVT, L cataract removed but has R cataract   Patient Stated Goals Eliminate imbalance   Currently in Pain? No/denies                Vestibular Assessment - 11/06/14 0946    Orthostatics   BP supine (x 5 minutes) 120/72 mmHg   HR supine (x 5 minutes) 67   BP sitting 122/84 mmHg  pt reported "wobbly feeling" upon sitting up   HR sitting 80   BP standing (after 1 minute) 105/67 mmHg  slight wobbly feeling upon standing   HR standing (after 1 minute) 80                  Vestibular Treatment/Exercise - 11/06/14 0001    Vestibular Treatment/Exercise   Vestibular Treatment Provided Gaze   Gaze Exercises X1 Viewing Horizontal;X1 Viewing Vertical   X1 Viewing Horizontal   Foot Position seated   Time --  20-30 sec.   Reps 2   Comments Cues for technique. Pt reported mild dizziness.  X1 Viewing Vertical   Foot Position seated   Time --  20-30 seconds   Reps 2   Comments Cues for technique. Pt reported mild dizziness.               PT Education - 11/06/14 1311    Education provided Yes   Education Details PT educated pt on orthostatic hypotension and the importance of waiting in seated position prior to walking for safety. PT also encouraged pt to notify MD of pt's c/o intermittent N/T of tongue and that pt wakes up clammy with sweat on her chest. PT educated pt on gaze stab. HEP and tips.   Person(s) Educated Patient   Methods Explanation;Demonstration;Tactile cues;Verbal cues;Handout   Comprehension Verbalized understanding;Returned demonstration          PT Short Term Goals - 11/01/14 1622    PT SHORT TERM GOAL #1   Title same as LTGs.           PT Long Term Goals - 11/06/14 1314    PT LONG TERM GOAL #1   Title Pt will be  IND in HEP to decr. dizziness, improve balance and safety during amb. Target date: 11/29/14.   Status On-going   PT LONG TERM GOAL #2   Title Pt will improve DGI from 18/24 to >/=20/24 to decr. falls risk. Target date: 11/29/14.   Status On-going   PT LONG TERM GOAL #3   Title Pt will amb. 600' over even/uneven terrain, while performing head turns, at MOD I level and no AD to improve functional mobillity. Target date: 11/29/14.   Status On-going   PT LONG TERM GOAL #4   Title Pt will report dizziness at its worse is 2/10 vs. 4-5/10 in order to amb. safely and perform ADLs safey. Target date: 11/29/14.   Status On-going               Plan - 11/06/14 1313    Clinical Impression Statement Pt's diastolic decr. >30mmHg during sit to stand and pt reported dizziness while assessing for orthostatic hypotension, indicating pt has orthostatic hypotension. Pt tolerated gaze stab. HEP well, as she reported only mild dizziness and no nausea. Continue with POC.   Pt will benefit from skilled therapeutic intervention in order to improve on the following deficits Abnormal gait;Decreased endurance;Postural dysfunction;Impaired flexibility;Decreased range of motion;Dizziness;Decreased mobility;Decreased balance;Decreased knowledge of use of DME   Rehab Potential Good   PT Frequency 2x / week   PT Duration 4 weeks   PT Treatment/Interventions ADLs/Self Care Home Management;Neuromuscular re-education;Biofeedback;Canalith Repostioning;Vestibular;Manual techniques;Balance training;Therapeutic exercise;Therapeutic activities;Functional mobility training;Gait training;Stair training;Patient/family education;DME Instruction   PT Next Visit Plan Perform SOT and initiate balance HEP.   PT Home Exercise Plan Gaze stab. HEP   Consulted and Agree with Plan of Care Patient        Problem List Patient Active Problem List   Diagnosis Date Noted  . DVT (deep venous thrombosis) ([facility]) 10/12/2013  . Arthritis,  senescent 10/12/2013  . Urinary retention 10/12/2013  . Heterozygous factor V Leiden mutation ([facility]) 10/12/2013  . FH: pulmonary embolism 10/12/2013    Ladonne Sharples L 11/06/2014, 1:15 PM  [facility] [address] [place], [place], [address] Phone: [phone]   Fax:  [phone]  Name: Diane Roach MRN: [id number] Date of Birth: 17-May-1930    Geoffry Paradise, PT,DPT 11/06/2014 1:15 PM Phone: [phone] Fax: [phone]

## 2014-11-06 NOTE — Patient Instructions (Addendum)
Orthostatic Hypotension Orthostatic hypotension is a sudden drop in blood pressure. It happens when you quickly stand up from a seated or lying position. You may feel dizzy or light-headed. This can last for just a few seconds or for up to a few minutes. It is usually not a serious problem. However, if this happens frequently or gets worse, it can be a sign of something more serious. CAUSES  Different things can cause orthostatic hypotension, including:   Loss of body fluids (dehydration).  Medicines that lower blood pressure.  Sudden changes in posture, such as standing up quickly after you have been sitting or lying down.  Taking too much of your medicine. SIGNS AND SYMPTOMS   Light-headedness or dizziness.   Fainting or near-fainting.   A fast heart rate.   Weakness.   Feeling tired (fatigue).  DIAGNOSIS  Your health care provider may do several things to help diagnose your condition and identify the cause. These may include:   Taking a medical history and doing a physical exam.  Checking your blood pressure. Your health care provider will check your blood pressure when you are:  Lying down.  Sitting.  Standing.  Using tilt table testing. In this test, you lie down on a table that moves from a lying position to a standing position. You will be strapped onto the table. This test monitors your blood pressure and heart rate when you are in different positions. TREATMENT  Treatment will vary depending on the cause. Possible treatments include:   Changing the dosage of your medicines.  Wearing compression stockings on your lower legs.  Standing up slowly after sitting or lying down.  Eating more salt.  Eating frequent, small meals.  In some cases, getting IV fluids.  Taking medicine to enhance fluid retention. HOME CARE INSTRUCTIONS  Only take over-the-counter or prescription medicines as directed by your health care provider.  Follow your health care  provider's instructions for changing the dosage of your current medicines.  Do not stop or adjust your medicine on your own.  Stand up slowly after sitting or lying down. This allows your body to adjust to the different position.  Wear compression stockings as directed.  Eat extra salt as directed.  Do not add extra salt to your diet unless directed to by your health care provider.  Eat frequent, small meals.  Avoid standing suddenly after eating.  Avoid hot showers or excessive heat as directed by your health care provider.  Keep all follow-up appointments. SEEK MEDICAL CARE IF:  You continue to feel dizzy or light-headed after standing.  You feel groggy or confused.  You feel cold, clammy, or sick to your stomach (nauseous).  You have blurred vision.  You feel short of breath. SEEK IMMEDIATE MEDICAL CARE IF:   You faint after standing.  You have chest pain.  You have difficulty breathing.   You lose feeling or movement in your arms or legs.   You have slurred speech or difficulty talking, or you are unable to talk.  MAKE SURE YOU:   Understand these instructions.  Will watch your condition.  Will get help right away if you are not doing well or get worse.   This information is not intended to replace advice given to you by your health care provider. Make sure you discuss any questions you have with your health care provider.   Document Released: 12/26/2001 Document Revised: 01/10/2013 Document Reviewed: 10/28/2012 Elsevier Interactive Patient Education 2016 Elsevier Inc.  Gaze Stabilization:  Tip Card  1.Target must remain in focus, not blurry, and appear stationary while head is in motion. 2.Perform exercises with small head movements (45 to either side of midline). 3.Increase speed of head motion so long as target is in focus. 4.If you wear eyeglasses, be sure you can see target through lens (therapist will give specific instructions for bifocal /  progressive lenses). 5.These exercises may provoke dizziness or nausea. Work through these symptoms. If too dizzy, slow head movement slightly. Rest between each exercise. 6.Exercises demand concentration; avoid distractions. 7.For safety, perform standing exercises close to a counter, wall, corner, or next to someone.  Copyright  VHI. All rights reserved.      Gaze Stabilization: Sitting    Keeping eyes on target on wall 3-5 feet away, tilt head down 15-30 and move head side to side for __20-30__ seconds. Repeat while moving head up and down for _20-30___ seconds. Do __3-5__ sessions per day. Place card on a solid (no pattern) background. Progress to standing as tolerated.  Copyright  VHI. All rights reserved.

## 2014-11-08 ENCOUNTER — Ambulatory Visit: Payer: Medicare Other

## 2014-11-08 DIAGNOSIS — R2681 Unsteadiness on feet: Secondary | ICD-10-CM

## 2014-11-08 DIAGNOSIS — R42 Dizziness and giddiness: Secondary | ICD-10-CM | POA: Diagnosis not present

## 2014-11-08 DIAGNOSIS — R269 Unspecified abnormalities of gait and mobility: Secondary | ICD-10-CM

## 2014-11-08 NOTE — Therapy (Signed)
[facility] [address] [place], [place], [address] Phone: [phone]   Fax:  [phone]  Physical Therapy Treatment  Patient Details  Name: Diane Roach MRN: [id number] Date of Birth: 1930-01-21 No Data Recorded  Encounter Date: 11/08/2014      PT End of Session - 11/08/14 0948    Visit Number 3   Number of Visits 9   Date for PT Re-Evaluation 12/01/14   Authorization Type G-code every 10th visit.   PT Start Time 0803   PT Stop Time 0847   PT Time Calculation (min) 44 min   Equipment Utilized During Treatment --  harness in SOT and min guard/min A during balance HEP   Activity Tolerance Patient tolerated treatment well   Behavior During Therapy [facility] for tasks assessed/performed      Past Medical History  Diagnosis Date  . Vitamin D deficiency   . Vitamin B deficiency   . DVT (deep venous thrombosis) (HCC)     left lower extremity  . Heterozygous factor V Leiden mutation ([place])   . Heart murmur     was told years ago she had a murmur but no one mentions it now.  . Frequent UTI     hx of   . GERD (gastroesophageal reflux disease)   . History of hiatal hernia   . Arthritis   . Cancer ([facility])     skin cancer (removed)  . Blood dyscrasia     low platelets  . Intermittent self-catheterization of bladder [facility])     chronic urinary retention, sees urologist    Past Surgical History  Procedure Laterality Date  . Bunionectomy Bilateral   . Hernia repair    . Cataract extraction Left 1999    with lens implant  . Cholecystectomy    . Appendectomy    . Tonsillectomy    . Abdominal hysterectomy    . Joint replacement Right 2007    hip  . Colonoscopy w/ polypectomy    . Microlaryngoscopy w/vocal cord injection N/A 04/30/2014    Procedure: MICROLARYNGOSCOPY WITH VOCAL CORD INJECTION;  Surgeon: Melida Quitter, MD;  Location: [facility];  Service: ENT;  Laterality: N/A;  Micro Direct Laryngoscopy with Prolaryn  injection/jet ventilation    There were no vitals filed for this visit.  Visit Diagnosis:  Unsteadiness  Dizziness and giddiness  Abnormality of gait      Subjective Assessment - 11/08/14 0808    Subjective (p) Pt denied falls or changes since last visit. Pt stated she performed HEP and feels ready to try in standing. Pt denied dizziness this morning but feels a wobbly/uneasiness.   Pertinent History (p) DVT, L cataract removed but has R cataract   Patient Stated Goals (p) Eliminate imbalance   Currently in Pain? (p) No/denies         Neuro re-ed: Neuro re-ed: sensory organization test performed with following results: Conditions: 1:  WNL 2:  WNL 3:  One fall and then WNL 4: One trial below normal and two trials WNL 5: WNL 6: One fall and then two trials WNL Composite score:  61 Sensory Analysis Som: WNL Vis:WNL Vest: WNL Pref: Below normal limits Strategy analysis:      Pt did not use hip strategy during conditions 3 and 6, otherwise, good ankle/hip strategies. COG alignment:      Mostly in midline, with slight posterior lean, to the R side.  Pt performed balance HEP in corner with chair in front of  her for safety, with supervision-min guard and min A during 1 LOB episodes (which pt did not use chair to maintain balance). Cues for technique. Please see pt instructions for details.                         PT Education - 11/08/14 0946    Education Details PT  educated pt on SOT results and balance HEP. Pt asked if we could determine if PT could determine if symptoms are due to a neurological issue, PT educated pt that PT cannot diagnosis a medical issue and that PT will refer pt back to MD if symptoms do not resolve after PT treatments.   Person(s) Educated Patient   Methods Explanation;Demonstration;Verbal cues;Handout   Comprehension Verbalized understanding;Returned demonstration          PT Short Term Goals - 11/01/14 1622    PT SHORT  TERM GOAL #1   Title same as LTGs.           PT Long Term Goals - 11/06/14 1314    PT LONG TERM GOAL #1   Title Pt will be IND in HEP to decr. dizziness, improve balance and safety during amb. Target date: 11/29/14.   Status On-going   PT LONG TERM GOAL #2   Title Pt will improve DGI from 18/24 to >/=20/24 to decr. falls risk. Target date: 11/29/14.   Status On-going   PT LONG TERM GOAL #3   Title Pt will amb. 600' over even/uneven terrain, while performing head turns, at MOD I level and no AD to improve functional mobillity. Target date: 11/29/14.   Status On-going   PT LONG TERM GOAL #4   Title Pt will report dizziness at its worse is 2/10 vs. 4-5/10 in order to amb. safely and perform ADLs safey. Target date: 11/29/14.   Status On-going               Plan - 11/08/14 0948    Clinical Impression Statement Pt's SOT composite score is less than WNL for age group of 42-79 (as SOT does not have normative data for >85 y/o). SOT results indicated pt's 3 balance systems are WNL, however, pt's preference is decreased indicating she has difficulty determining when to use the appropriate system during each condition. Pt also experienced increased sway and 2 falls (when vestibular system was tested).  Pt also experienced incr. sway and incr. "wobbly" feeling when performing balance HEP on compliant surfaces with eyes closed, indicating possible decr. vestibular input. Conitnue with POC.   Pt will benefit from skilled therapeutic intervention in order to improve on the following deficits Abnormal gait;Decreased endurance;Postural dysfunction;Impaired flexibility;Decreased range of motion;Dizziness;Decreased mobility;Decreased balance;Decreased knowledge of use of DME   Rehab Potential Good   PT Frequency 2x / week   PT Duration 4 weeks   PT Treatment/Interventions ADLs/Self Care Home Management;Neuromuscular re-education;Biofeedback;Canalith Repostioning;Vestibular;Manual techniques;Balance  training;Therapeutic exercise;Therapeutic activities;Functional mobility training;Gait training;Stair training;Patient/family education;DME Instruction   PT Next Visit Plan High level balance and gait activities on compliant/uneven surfaces.   PT Home Exercise Plan Gaze stab. and balance HEP.   Consulted and Agree with Plan of Care Patient        Problem List Patient Active Problem List   Diagnosis Date Noted  . DVT (deep venous thrombosis) ([facility]) 10/12/2013  . Arthritis, senescent 10/12/2013  . Urinary retention 10/12/2013  . Heterozygous factor V Leiden mutation ([facility]) 10/12/2013  . FH: pulmonary embolism 10/12/2013    Diane Roach  L 11/08/2014, 9:54 AM  [facility] [address] [place], [place], [address] Phone: [phone]   Fax:  [phone]  Name: Diane Roach MRN: [id number] Date of Birth: 1930-10-20    Geoffry Paradise, PT,DPT 11/08/2014 9:54 AM Phone: [phone] Fax: [phone]

## 2014-11-08 NOTE — Patient Instructions (Signed)
Feet Together (Compliant Surface) Varied Arm Positions - Eyes Open    With eyes open, standing on compliant surface: __pillow______, feet together and arms at your side, look at a stationary object. Hold __30__ seconds. Repeat __3__ times per session. Do _1___ sessions per day.  Copyright  VHI. All rights reserved.   Feet Apart (Compliant Surface) Varied Arm Positions - Eyes Closed    Stand on compliant surface: __pillow______ with feet shoulder width apart and arms at your side. Close eyes and visualize upright position. Hold__10-20__ seconds. Repeat __3__ times per session. Do __1__ sessions per day.  Copyright  VHI. All rights reserved.    Feet Apart (Compliant Surface) Head Motion - Eyes Open    With eyes open, standing on compliant surface: ___pillows_____, feet shoulder width apart, move head slowly: up and down 10 times and side and side 10 times. Repeat __3__ times per session. Do __1__ sessions per day.  Copyright  VHI. All rights reserved.

## 2014-11-12 DIAGNOSIS — N1330 Unspecified hydronephrosis: Secondary | ICD-10-CM | POA: Diagnosis not present

## 2014-11-12 DIAGNOSIS — N3942 Incontinence without sensory awareness: Secondary | ICD-10-CM | POA: Diagnosis not present

## 2014-11-13 ENCOUNTER — Ambulatory Visit: Payer: Medicare Other

## 2014-11-13 DIAGNOSIS — R42 Dizziness and giddiness: Secondary | ICD-10-CM

## 2014-11-13 DIAGNOSIS — R2681 Unsteadiness on feet: Secondary | ICD-10-CM

## 2014-11-13 DIAGNOSIS — R269 Unspecified abnormalities of gait and mobility: Secondary | ICD-10-CM

## 2014-11-13 NOTE — Therapy (Signed)
[facility] [address] [place], [place], [address] Phone: [phone]   Fax:  [phone]  Physical Therapy Treatment  Patient Details  Name: Diane Roach MRN: [id number] Date of Birth: [birth year] No Data Recorded  Encounter Date: 11/13/2014      PT End of Session - 11/13/14 1015    Visit Number 4   Number of Visits 9   Date for PT Re-Evaluation 12/01/14   Authorization Type G-code every 10th visit.   PT Start Time [phone]   PT Stop Time 1012   PT Time Calculation (min) 41 min   Equipment Utilized During Treatment Gait belt   Activity Tolerance Patient tolerated treatment well   Behavior During Therapy WFL for tasks assessed/performed      Past Medical History  Diagnosis Date  . Vitamin D deficiency   . Vitamin B deficiency   . DVT (deep venous thrombosis) (HCC)     left lower extremity  . Heterozygous factor V Leiden mutation ([place])   . Heart murmur     was told years ago she had a murmur but no one mentions it now.  . Frequent UTI     hx of   . GERD (gastroesophageal reflux disease)   . History of hiatal hernia   . Arthritis   . Cancer ([place])     skin cancer (removed)  . Blood dyscrasia     low platelets  . Intermittent self-catheterization of bladder [facility])     chronic urinary retention, sees urologist    Past Surgical History  Procedure Laterality Date  . Bunionectomy Bilateral   . Hernia repair    . Cataract extraction Left 1999    with lens implant  . Cholecystectomy    . Appendectomy    . Tonsillectomy    . Abdominal hysterectomy    . Joint replacement Right 2007    hip  . Colonoscopy w/ polypectomy    . Microlaryngoscopy w/vocal cord injection N/A 04/30/2014    Procedure: MICROLARYNGOSCOPY WITH VOCAL CORD INJECTION;  Surgeon: Melida Quitter, MD;  Location: [facility];  Service: ENT;  Laterality: N/A;  Micro Direct Laryngoscopy with Prolaryn injection/jet ventilation    There were no  vitals filed for this visit.  Visit Diagnosis:  Dizziness and giddiness  Abnormality of gait  Unsteadiness      Subjective Assessment - 11/13/14 0933    Subjective Pt reported she went to the urologist yesterday and MD told pt she was fine for another year and he did not see any reason for further testing. Pt reports she is still self cath. everyday. Pt reported a little heaviness in head today. Pt reported she is still slightly "wobbly", she had difficulty standing at church choir with 2" heels donned.   Pertinent History DVT, L cataract removed but has R cataract   Patient Stated Goals Eliminate imbalance   Currently in Pain? No/denies     Therex: In seated position: B upper trap and levator scap. Stretches 3x30 seconds/stretch. VC's and manual cues for technique. See pt instructions for details.  Manual therapy: In supine: PT performed cervical distraction to decr. Tension, 30 sec.-1 minute holds. PT performed B upper trap and levator scap stretches with overpressure, 3x30sec. Holds. Pt reported she felt better afterwards and more relaxed.  Neuro re-ed: In // bars with min guard-min A, intermittent use of 1 UE support, 4x7'/activity: marching, forward ambulation with vertical/horizontal head turns. Cues for technique. Pt required min A during 5 LOB  episodes.                           PT Education - 11/13/14 1013    Education provided Yes   Education Details PT educated pt on stretching HEP to reduce neck pain. Pt asked PT if massage would help, and PT encouraged pt to seek massage to reduce muscle tension and to stay hydrated.   Person(s) Educated Patient   Methods Explanation;Demonstration;Tactile cues;Verbal cues;Handout   Comprehension Verbalized understanding;Returned demonstration          PT Short Term Goals - 11/01/14 1622    PT SHORT TERM GOAL #1   Title same as LTGs.           PT Long Term Goals - 11/06/14 1314    PT LONG TERM GOAL #1    Title Pt will be IND in HEP to decr. dizziness, improve balance and safety during amb. Target date: 11/29/14.   Status On-going   PT LONG TERM GOAL #2   Title Pt will improve DGI from 18/24 to >/=20/24 to decr. falls risk. Target date: 11/29/14.   Status On-going   PT LONG TERM GOAL #3   Title Pt will amb. 600' over even/uneven terrain, while performing head turns, at MOD I level and no AD to improve functional mobillity. Target date: 11/29/14.   Status On-going   PT LONG TERM GOAL #4   Title Pt will report dizziness at its worse is 2/10 vs. 4-5/10 in order to amb. safely and perform ADLs safey. Target date: 11/29/14.   Status On-going               Plan - 11/13/14 1015    Clinical Impression Statement Pt demonstrated progress, as she reported decreased head "heaviness" after manual therapy and stretches. Pt continues to experience LOB and incr. postural sway during dynamic balance activities, especially during head turns which indicates decr. vestibular input. Continue with POC.   Pt will benefit from skilled therapeutic intervention in order to improve on the following deficits Abnormal gait;Decreased endurance;Postural dysfunction;Impaired flexibility;Decreased range of motion;Dizziness;Decreased mobility;Decreased balance;Decreased knowledge of use of DME   Rehab Potential Good   PT Frequency 2x / week   PT Duration 4 weeks   PT Treatment/Interventions ADLs/Self Care Home Management;Neuromuscular re-education;Biofeedback;Canalith Repostioning;Vestibular;Manual techniques;Balance training;Therapeutic exercise;Therapeutic activities;Functional mobility training;Gait training;Stair training;Patient/family education;DME Instruction   PT Next Visit Plan Review stretches prn, gait/balance on uneven surfaces with head turns.   PT Home Exercise Plan Gaze stab. and stretch/balance HEP.   Consulted and Agree with Plan of Care Patient        Problem List Patient Active Problem List    Diagnosis Date Noted  . DVT (deep venous thrombosis) ([place]) 10/12/2013  . Arthritis, senescent 10/12/2013  . Urinary retention 10/12/2013  . Heterozygous factor V Leiden mutation ([place]) 10/12/2013  . FH: pulmonary embolism 10/12/2013    Diane Roach L 11/13/2014, 10:18 AM  [facility] [address] [place], [place], [address] Phone: [phone]   Fax:  [phone]  Name: Diane Roach MRN: [id number] Date of Birth: 08-Jun-1930    Geoffry Paradise, PT,DPT 11/13/2014 10:18 AM Phone: [phone] Fax: [phone]

## 2014-11-13 NOTE — Patient Instructions (Signed)
Flexibility: Upper Trapezius Stretch    Gently grasp right side of head while relaxing other hand beside you. Tilt head away until a gentle stretch is felt. Hold __30__ seconds. Repeat to other side (left ear towards left shoulder). Repeat __3__ times per set. Do __1__ sets per session. Do __2__ sessions per day.  http://orth.exer.us/341   Copyright  VHI. All rights reserved.   Levator Scapula Stretch, Sitting    Sit, one hand at your side hip on side to be stretched, other hand over top of head. Turn head toward other side and look down (towards arm pit). Use hand on head to gently stretch neck in that position. Hold _30__ seconds. Repeat to the other side. Repeat _3__ times per session. Do _2__ sessions per day.  Copyright  VHI. All rights reserved.

## 2014-11-15 ENCOUNTER — Ambulatory Visit: Payer: Medicare Other

## 2014-11-15 DIAGNOSIS — R269 Unspecified abnormalities of gait and mobility: Secondary | ICD-10-CM

## 2014-11-15 DIAGNOSIS — R2681 Unsteadiness on feet: Secondary | ICD-10-CM | POA: Diagnosis not present

## 2014-11-15 DIAGNOSIS — R42 Dizziness and giddiness: Secondary | ICD-10-CM | POA: Diagnosis not present

## 2014-11-15 NOTE — Patient Instructions (Signed)
   Lie on back, with towel or pillow under head. Tuck chin (double chin) and press back of head into pillow, hold 2 seconds. Repeat 10 times every day.

## 2014-11-15 NOTE — Therapy (Signed)
[facility] [address] [place], [place], [address] Phone: [phone]   Fax:  [phone]  Physical Therapy Treatment  Patient Details  Name: Diane Roach MRN: [id number] Date of Birth: Jun 04, 1930 No Data Recorded  Encounter Date: 11/15/2014      PT End of Session - 11/15/14 1549    Visit Number 5   Number of Visits 9   Date for PT Re-Evaluation 12/01/14   Authorization Type G-code every 10th visit.   PT Start Time [phone]   PT Stop Time 1014   PT Time Calculation (min) 41 min   Equipment Utilized During Treatment Gait belt   Activity Tolerance Patient tolerated treatment well   Behavior During Therapy WFL for tasks assessed/performed      Past Medical History  Diagnosis Date  . Vitamin D deficiency   . Vitamin B deficiency   . DVT (deep venous thrombosis) (HCC)     left lower extremity  . Heterozygous factor V Leiden mutation ([place])   . Heart murmur     was told years ago she had a murmur but no one mentions it now.  . Frequent UTI     hx of   . GERD (gastroesophageal reflux disease)   . History of hiatal hernia   . Arthritis   . Cancer ([place])     skin cancer (removed)  . Blood dyscrasia     low platelets  . Intermittent self-catheterization of bladder [facility])     chronic urinary retention, sees urologist    Past Surgical History  Procedure Laterality Date  . Bunionectomy Bilateral   . Hernia repair    . Cataract extraction Left 1999    with lens implant  . Cholecystectomy    . Appendectomy    . Tonsillectomy    . Abdominal hysterectomy    . Joint replacement Right 2007    hip  . Colonoscopy w/ polypectomy    . Microlaryngoscopy w/vocal cord injection N/A 04/30/2014    Procedure: MICROLARYNGOSCOPY WITH VOCAL CORD INJECTION;  Surgeon: Melida Quitter, MD;  Location: [facility];  Service: ENT;  Laterality: N/A;  Micro Direct Laryngoscopy with Prolaryn injection/jet ventilation    There were no  vitals filed for this visit.  Visit Diagnosis:  Dizziness and giddiness  Abnormality of gait  Unsteadiness      Subjective Assessment - 11/15/14 0935    Subjective (p) Pt reported she has been performing stretches and feels that the stretches are helping.   Pertinent History (p) DVT, L cataract removed but has R cataract   Patient Stated Goals (p) Eliminate imbalance   Currently in Pain? (p) No/denies         Therex: Pt performed supine cervical (cx) ext. With pillow placed under pt's head. 2 second hold x10 reps. Cues for technique. Please see pt instructions for details.  Manual therapy: Pt in supine: PT performed cx distraction with 30 sec. To 1 minute holds to decr. Pain.  Pt performed B cervical rotation joint mobilizations to C3-C4, C4-C5, and C5-C6. Grades 3-4, 30-45 second bouts x3 reps at each level. Pt with improved B cervical rotation after mobs and decr. Pain. Pt reported neck pain was 5-6/10 prior to mobs and 2/10 after mobs and ischemic compression. Ischemic compression to R levator scap and R UT, with pressure maintained until pt reported (and PT felt) trigger point subside. Pt reported less pain with Cx motion afterwards. Pt reported she felt relaxed, and reported "It's wonderful that  I can turn my head more and without pain."                [facility] Adult PT Treatment/Exercise - 11/15/14 1547    High Level Balance   High Level Balance Activities Head turns   High Level Balance Comments In // bars without UE support with min guard: pt performed horizontal/vertical head turns, changing head direction every 2 steps. Cues for technique. 4x7'/activity.                PT Education - 11/15/14 1548    Education provided Yes   Education Details PT educated pt on the importance of performing HEP to reduce pain, improve ROM, and decr. "wobbly feeling/head heaviness". Pt performed cx ext. HEP.   Person(s) Educated Patient   Methods Explanation;Verbal  cues;Handout   Comprehension Verbalized understanding;Returned demonstration          PT Short Term Goals - 11/01/14 1622    PT SHORT TERM GOAL #1   Title same as LTGs.           PT Long Term Goals - 11/06/14 1314    PT LONG TERM GOAL #1   Title Pt will be IND in HEP to decr. dizziness, improve balance and safety during amb. Target date: 11/29/14.   Status On-going   PT LONG TERM GOAL #2   Title Pt will improve DGI from 18/24 to >/=20/24 to decr. falls risk. Target date: 11/29/14.   Status On-going   PT LONG TERM GOAL #3   Title Pt will amb. 600' over even/uneven terrain, while performing head turns, at MOD I level and no AD to improve functional mobillity. Target date: 11/29/14.   Status On-going   PT LONG TERM GOAL #4   Title Pt will report dizziness at its worse is 2/10 vs. 4-5/10 in order to amb. safely and perform ADLs safey. Target date: 11/29/14.   Status On-going               Plan - 11/15/14 1550    Clinical Impression Statement Pt demonstrated progress, as she was able to perform B cx rotation with improve AROM and reduced pain after manual therapy. Pt continues to experience incr. postural sway during head turns/amb., indicating decr. vestibular input. Pt would continue to benefit from skilled PT to improve safety during functional mobility.   Pt will benefit from skilled therapeutic intervention in order to improve on the following deficits Abnormal gait;Decreased endurance;Postural dysfunction;Impaired flexibility;Decreased range of motion;Dizziness;Decreased mobility;Decreased balance;Decreased knowledge of use of DME   Rehab Potential Good   PT Frequency 2x / week   PT Duration 4 weeks   PT Treatment/Interventions ADLs/Self Care Home Management;Neuromuscular re-education;Biofeedback;Canalith Repostioning;Vestibular;Manual techniques;Balance training;Therapeutic exercise;Therapeutic activities;Functional mobility training;Gait training;Stair  training;Patient/family education;DME Instruction   PT Next Visit Plan Manual therapy prn, gait/balance on uneven surfaces with head turns.   PT Home Exercise Plan Gaze stab. and stretch/balance and isometric cx ext. HEP.   Consulted and Agree with Plan of Care Patient        Problem List Patient Active Problem List   Diagnosis Date Noted  . DVT (deep venous thrombosis) ([place]) 10/12/2013  . Arthritis, senescent 10/12/2013  . Urinary retention 10/12/2013  . Heterozygous factor V Leiden mutation ([place]) 10/12/2013  . FH: pulmonary embolism 10/12/2013    Miller,Jennifer L 11/15/2014, 3:53 PM  [facility] [address] [place], [place], [address] Phone: [phone]   Fax:  [phone]  Name: Diane  Roach MRN: [id number] Date of Birth: 1930/07/10    Geoffry Paradise, PT,DPT 11/15/2014 3:53 PM Phone: [phone] Fax: [phone]

## 2014-11-20 ENCOUNTER — Ambulatory Visit: Payer: Medicare Other | Attending: Family Medicine

## 2014-11-20 DIAGNOSIS — R2681 Unsteadiness on feet: Secondary | ICD-10-CM

## 2014-11-20 DIAGNOSIS — R42 Dizziness and giddiness: Secondary | ICD-10-CM | POA: Diagnosis not present

## 2014-11-20 DIAGNOSIS — R269 Unspecified abnormalities of gait and mobility: Secondary | ICD-10-CM

## 2014-11-21 NOTE — Therapy (Signed)
[facility] [address] [place], [place], [address] Phone: [phone]   Fax:  [phone]  Physical Therapy Treatment  Patient Details  Name: Diane Roach MRN: [id number] Date of Birth: [birth year] No Data Recorded  Encounter Date: 11/20/2014      PT End of Session - 11/21/14 1157    Visit Number 6   Number of Visits 9   Date for PT Re-Evaluation 12/01/14   Authorization Type G-code every 10th visit.   PT Start Time 1017   PT Stop Time 1059   PT Time Calculation (min) 42 min   Equipment Utilized During Treatment Gait belt   Activity Tolerance Patient tolerated treatment well   Behavior During Therapy WFL for tasks assessed/performed      Past Medical History  Diagnosis Date  . Vitamin D deficiency   . Vitamin B deficiency   . DVT (deep venous thrombosis) (HCC)     left lower extremity  . Heterozygous factor V Leiden mutation ([place])   . Heart murmur     was told years ago she had a murmur but no one mentions it now.  . Frequent UTI     hx of   . GERD (gastroesophageal reflux disease)   . History of hiatal hernia   . Arthritis   . Cancer ([place])     skin cancer (removed)  . Blood dyscrasia     low platelets  . Intermittent self-catheterization of bladder [facility])     chronic urinary retention, sees urologist    Past Surgical History  Procedure Laterality Date  . Bunionectomy Bilateral   . Hernia repair    . Cataract extraction Left 1999    with lens implant  . Cholecystectomy    . Appendectomy    . Tonsillectomy    . Abdominal hysterectomy    . Joint replacement Right 2007    hip  . Colonoscopy w/ polypectomy    . Microlaryngoscopy w/vocal cord injection N/A 04/30/2014    Procedure: MICROLARYNGOSCOPY WITH VOCAL CORD INJECTION;  Surgeon: Melida Quitter, MD;  Location: [facility];  Service: ENT;  Laterality: N/A;  Micro Direct Laryngoscopy with Prolaryn injection/jet ventilation    There were no vitals  filed for this visit.  Visit Diagnosis:  Dizziness and giddiness  Abnormality of gait  Unsteadiness      Subjective Assessment - 11/20/14 1018    Subjective (p) Pt reported she has been feeling a little bit improved since last session.  Pt denied falls since last visit.    Pertinent History (p) DVT, L cataract removed but has R cataract   Patient Stated Goals (p) Eliminate imbalance   Currently in Pain? (p) No/denies           Manual therapy: Pt in supine: PT performed cx distraction with 30 sec. To 1 minute holds to decr. Pain., x4 reps. Pt performed B cervical rotation joint mobilizations to C3-C4, C4-C5, and C5-C6. Grades 3-4, 30-45 second bouts x3 reps at each level.  Pt with improved R cervical rotation after mobs and decr. "discomfort" per pt. Pt reported improved discomfort and PT noted improve cx rotation to the L side after ischemic compression to L neck extensor musculature.Pt unable to rate pain today, but stated it was more "discomfort".   Gait: Pt amb. 500' outdoors over even and paved surfaces while performing head turns, pt noted to experence incr. Postural sway with head turns. Cues to decr. Narrow BOS during head turns. Performed with supervision.  Neuro re-ed: At counter: with 0-1 UE support and min guard to min A to maintain balance. On red mat, 4x7'/activity. Pt performed backwards amb., forward amb. With eyes closed, braiding, and forward amb. With head turns. Cues for technique.                      PT Short Term Goals - 11/01/14 1622    PT SHORT TERM GOAL #1   Title same as LTGs.           PT Long Term Goals - 11/06/14 1314    PT LONG TERM GOAL #1   Title Pt will be IND in HEP to decr. dizziness, improve balance and safety during amb. Target date: 11/29/14.   Status On-going   PT LONG TERM GOAL #2   Title Pt will improve DGI from 18/24 to >/=20/24 to decr. falls risk. Target date: 11/29/14.   Status On-going   PT LONG TERM  GOAL #3   Title Pt will amb. 600' over even/uneven terrain, while performing head turns, at MOD I level and no AD to improve functional mobillity. Target date: 11/29/14.   Status On-going   PT LONG TERM GOAL #4   Title Pt will report dizziness at its worse is 2/10 vs. 4-5/10 in order to amb. safely and perform ADLs safey. Target date: 11/29/14.   Status On-going               Plan - 11/21/14 1158    Clinical Impression Statement Pt demonstrated progress as she reported decr. discomfort during B cervical rotation after manual therapy. Pt's L neck pain during cervcial rotation appeared to be from muscle tightness vs. limited joint motion, as ischemic compression allowed pt to improve cx rotation ROM vs. jt. mobs today. Pt continues to experience incr. postural sway during dynamic gait/balance activities and would continue to benefit from skilled PT to improve safety during functional mobility.    Pt will benefit from skilled therapeutic intervention in order to improve on the following deficits Abnormal gait;Decreased endurance;Postural dysfunction;Impaired flexibility;Decreased range of motion;Dizziness;Decreased mobility;Decreased balance;Decreased knowledge of use of DME   Rehab Potential Good   PT Frequency 2x / week   PT Duration 4 weeks   PT Treatment/Interventions ADLs/Self Care Home Management;Neuromuscular re-education;Biofeedback;Canalith Repostioning;Vestibular;Manual techniques;Balance training;Therapeutic exercise;Therapeutic activities;Functional mobility training;Gait training;Stair training;Patient/family education;DME Instruction   PT Next Visit Plan Manual therapy prn, gait/balance on uneven surfaces with head turns.   PT Home Exercise Plan Gaze stab. and stretch/balance and isometric cx ext. HEP.   Consulted and Agree with Plan of Care Patient        Problem List Patient Active Problem List   Diagnosis Date Noted  . DVT (deep venous thrombosis) ([place]) 10/12/2013  .  Arthritis, senescent 10/12/2013  . Urinary retention 10/12/2013  . Heterozygous factor V Leiden mutation ([place]) 10/12/2013  . FH: pulmonary embolism 10/12/2013    Diane Roach L 11/21/2014, 12:01 PM  [facility] [address] [place], [place], [address] Phone: [phone]   Fax:  [phone]  Name: Diane Roach MRN: [id number] Date of Birth: [birth year]    Geoffry Paradise, PT,DPT 11/21/2014 12:01 PM Phone: [phone] Fax: [phone]

## 2014-11-22 ENCOUNTER — Ambulatory Visit: Payer: Medicare Other

## 2014-11-22 DIAGNOSIS — R2681 Unsteadiness on feet: Secondary | ICD-10-CM | POA: Diagnosis not present

## 2014-11-22 DIAGNOSIS — R269 Unspecified abnormalities of gait and mobility: Secondary | ICD-10-CM | POA: Diagnosis not present

## 2014-11-22 DIAGNOSIS — R42 Dizziness and giddiness: Secondary | ICD-10-CM | POA: Diagnosis not present

## 2014-11-22 NOTE — Patient Instructions (Signed)
1) Stand by bed with chair for safety: look straight ahead while focusing on one object, turn body to face opposite direction, while keeping eyes focused on target, finally turn head when it is no longer possible to focus on target and find a target in opposite direction. Always start moving body first.

## 2014-11-22 NOTE — Therapy (Signed)
[facility] [address] [place], [place], [address] Phone: [phone]   Fax:  [phone]  Physical Therapy Treatment  Patient Details  Name: Diane Roach MRN: [id number] Date of Birth: 1931-01-18 No Data Recorded  Encounter Date: 11/22/2014      PT End of Session - 11/22/14 1246    Visit Number 7   Number of Visits 9   Date for PT Re-Evaluation 12/01/14   Authorization Type G-code every 10th visit.   PT Start Time 1016   PT Stop Time 1058   PT Time Calculation (min) 42 min   Equipment Utilized During Treatment Gait belt   Activity Tolerance Patient tolerated treatment well   Behavior During Therapy WFL for tasks assessed/performed      Past Medical History  Diagnosis Date  . Vitamin D deficiency   . Vitamin B deficiency   . DVT (deep venous thrombosis) (HCC)     left lower extremity  . Heterozygous factor V Leiden mutation ([place])   . Heart murmur     was told years ago she had a murmur but no one mentions it now.  . Frequent UTI     hx of   . GERD (gastroesophageal reflux disease)   . History of hiatal hernia   . Arthritis   . Cancer ([place])     skin cancer (removed)  . Blood dyscrasia     low platelets  . Intermittent self-catheterization of bladder [facility])     chronic urinary retention, sees urologist    Past Surgical History  Procedure Laterality Date  . Bunionectomy Bilateral   . Hernia repair    . Cataract extraction Left 1999    with lens implant  . Cholecystectomy    . Appendectomy    . Tonsillectomy    . Abdominal hysterectomy    . Joint replacement Right 2007    hip  . Colonoscopy w/ polypectomy    . Microlaryngoscopy w/vocal cord injection N/A 04/30/2014    Procedure: MICROLARYNGOSCOPY WITH VOCAL CORD INJECTION;  Surgeon: Melida Quitter, MD;  Location: [facility];  Service: ENT;  Laterality: N/A;  Micro Direct Laryngoscopy with Prolaryn injection/jet ventilation    There were no vitals  filed for this visit.  Visit Diagnosis:  Abnormality of gait  Dizziness and giddiness  Unsteadiness      Subjective Assessment - 11/22/14 1018    Subjective Pt reported she feels a little better, and heaviness appears to be improving. Pt reports she still feels imbalanced at times, especially during walking and performing turns.  Pt reported she walked yesterday, 3 city blocks, and felt fine. She used a walking stick a little, during rush hour on [place].    Pertinent History DVT, L cataract removed but has R cataract   Patient Stated Goals Eliminate imbalance   Currently in Pain? No/denies                         [facility] Adult PT Treatment/Exercise - 11/22/14 1242    Balance   Balance Assessed Yes   Dynamic Standing Balance   Dynamic Standing - Balance Support Bilateral upper extremity supported   Dynamic Standing - Level of Assistance 4: Min assist;Other (comment)  min guard   Dynamic Standing - Balance Activities Eyes open;Eyes closed;Head turns;Head nods;Rocker board   Dynamic Standing - Comments In // bars with min guard to min A to maintain balance: pt perfomred rockerboard: ant/post/lat wt. shifting, static balance  holds with eyes open/closed, head turns with eyes open. Reps x10/direction or 30 second holds for static stance activities. Cues for technique and to shift wt. forward, as pt had tendency to lean in posterior direction. Min A required during duration of eyes closed and intermittent during head turns, especially during lat. weight shfiting.   High Level Balance   High Level Balance Activities Negotitating around obstacles;Negotiating over obstacles   High Level Balance Comments Outdoors over paved and grassy terrrain with min guard to min A to maintain balance: pt negotiated small and tall hurdles, cones and performed 180 degree turns. Cues to improve lat. wt. shifting and narrow BOS. Pt performed x4 over sidewalk and x4 over grassy terrain.     Neuro  re-ed: Pt performed 180 degree turns with cues and demonstration for technique. Pt reported slight "imbalance feeling" while turning to the left, which decreased after 2 reps of 180 turns. Please see pt instructions for details. Performed with min guard to S for safety.           PT Education - 11/22/14 1246    Education provided Yes   Education Details 180 degree turn HEP. PT discussed potential d/c next week based on outcomes of goals and pt progress.   Person(s) Educated Patient   Methods Explanation;Demonstration;Tactile cues;Verbal cues;Handout   Comprehension Returned demonstration;Verbalized understanding          PT Short Term Goals - 11/01/14 1622    PT SHORT TERM GOAL #1   Title same as LTGs.           PT Long Term Goals - 11/06/14 1314    PT LONG TERM GOAL #1   Title Pt will be IND in HEP to decr. dizziness, improve balance and safety during amb. Target date: 11/29/14.   Status On-going   PT LONG TERM GOAL #2   Title Pt will improve DGI from 18/24 to >/=20/24 to decr. falls risk. Target date: 11/29/14.   Status On-going   PT LONG TERM GOAL #3   Title Pt will amb. 600' over even/uneven terrain, while performing head turns, at MOD I level and no AD to improve functional mobillity. Target date: 11/29/14.   Status On-going   PT LONG TERM GOAL #4   Title Pt will report dizziness at its worse is 2/10 vs. 4-5/10 in order to amb. safely and perform ADLs safey. Target date: 11/29/14.   Status On-going               Plan - 11/22/14 1247    Clinical Impression Statement Pt demonstrated progress as she was able to tolerate performing high level balance activities. Pt continues to require min A during SLS activities and during activities which require incr. vestibular input. Continue with POC.   Pt will benefit from skilled therapeutic intervention in order to improve on the following deficits Abnormal gait;Decreased endurance;Postural dysfunction;Impaired  flexibility;Decreased range of motion;Dizziness;Decreased mobility;Decreased balance;Decreased knowledge of use of DME   Rehab Potential Good   PT Frequency 2x / week   PT Duration 4 weeks   PT Treatment/Interventions ADLs/Self Care Home Management;Neuromuscular re-education;Biofeedback;Canalith Repostioning;Vestibular;Manual techniques;Balance training;Therapeutic exercise;Therapeutic activities;Functional mobility training;Gait training;Stair training;Patient/family education;DME Instruction   PT Next Visit Plan Manual therapy prn, gait/balance on uneven surfaces with head turns. Begin to check goals.   PT Home Exercise Plan Gaze stab. and stretch/balance and isometric cx ext. HEP.   Consulted and Agree with Plan of Care Patient        Problem List  Patient Active Problem List   Diagnosis Date Noted  . DVT (deep venous thrombosis) ([place]) 10/12/2013  . Arthritis, senescent 10/12/2013  . Urinary retention 10/12/2013  . Heterozygous factor V Leiden mutation ([place]) 10/12/2013  . FH: pulmonary embolism 10/12/2013    Diane Roach L 11/22/2014, 12:49 PM  [facility] [address] [place], [place], [address] Phone: [phone]   Fax:  [phone]  Name: Aleesia Henney MRN: [id number] Date of Birth: 20-Apr-1930    Geoffry Paradise, PT,DPT 11/22/2014 12:49 PM Phone: [phone] Fax: [phone]

## 2014-11-27 ENCOUNTER — Ambulatory Visit: Payer: Medicare Other

## 2014-11-27 DIAGNOSIS — R269 Unspecified abnormalities of gait and mobility: Secondary | ICD-10-CM

## 2014-11-27 DIAGNOSIS — R2681 Unsteadiness on feet: Secondary | ICD-10-CM | POA: Diagnosis not present

## 2014-11-27 DIAGNOSIS — R42 Dizziness and giddiness: Secondary | ICD-10-CM | POA: Diagnosis not present

## 2014-11-27 NOTE — Therapy (Signed)
[facility] [address] [place], [place], [address] Phone: [phone]   Fax:  [phone]  Physical Therapy Treatment  Patient Details  Name: Diane Roach MRN: [id number] Date of Birth: 06/23/1930 No Data Recorded  Encounter Date: 11/27/2014      PT End of Session - 11/27/14 0846    Visit Number 8   Number of Visits 9   Date for PT Re-Evaluation 12/01/14   Authorization Type G-code every 10th visit.   PT Start Time 0802   PT Stop Time [phone]   PT Time Calculation (min) 40 min   Equipment Utilized During Treatment Gait belt  and harness in SOT   Activity Tolerance Patient tolerated treatment well   Behavior During Therapy [facility] for tasks assessed/performed      Past Medical History  Diagnosis Date  . Vitamin D deficiency   . Vitamin B deficiency   . DVT (deep venous thrombosis) (HCC)     left lower extremity  . Heterozygous factor V Leiden mutation ([place])   . Heart murmur     was told years ago she had a murmur but no one mentions it now.  . Frequent UTI     hx of   . GERD (gastroesophageal reflux disease)   . History of hiatal hernia   . Arthritis   . Cancer ([place])     skin cancer (removed)  . Blood dyscrasia     low platelets  . Intermittent self-catheterization of bladder [facility])     chronic urinary retention, sees urologist    Past Surgical History  Procedure Laterality Date  . Bunionectomy Bilateral   . Hernia repair    . Cataract extraction Left 1999    with lens implant  . Cholecystectomy    . Appendectomy    . Tonsillectomy    . Abdominal hysterectomy    . Joint replacement Right 2007    hip  . Colonoscopy w/ polypectomy    . Microlaryngoscopy w/vocal cord injection N/A 04/30/2014    Procedure: MICROLARYNGOSCOPY WITH VOCAL CORD INJECTION;  Surgeon: Melida Quitter, MD;  Location: [facility];  Service: ENT;  Laterality: N/A;  Micro Direct Laryngoscopy with Prolaryn injection/jet ventilation     There were no vitals filed for this visit.  Visit Diagnosis:  Abnormality of gait  Dizziness and giddiness  Unsteadiness      Subjective Assessment - 11/27/14 0805    Subjective (p) Pt reported she is sensing an improvement, she feels like she can turn her head without tightness and decreased heaviness.    Pertinent History (p) DVT, L cataract removed but has R cataract   Patient Stated Goals (p) Eliminate imbalance   Currently in Pain? (p) No/denies                    Neuro re-ed: sensory organization test performed with following results: Conditions: 1:  WNL 2:  WNL 3:  One trial below normal limits. 4: WNL 5: WNL 6: WNL Composite score:  72 Sensory Analysis Som: WNL Vis:WNL Vest: WNL Pref: WNL Strategy analysis:   Good use of both ankle and hip strategies.    COG alignment:      Slightly to the R side and anterior.             Balance Exercises - 11/27/14 0843    Balance Exercises: Standing   Standing Eyes Opened Wide (BOA);Other reps (comment);Other (comment);Foam/compliant surface  airex mat: marches   Rockerboard Head  turns;EO;EC;30 seconds;10 reps  no UE support, with board placed in ant/post. And lateral directions.   Other Standing Exercises Pt performed balance activities in // bars, no UE support with min guard to min A to maintain balance. Rockerboard as listed above and airex marches x10/LE. Cues to improve technique, and cues to improve lat. wt. shifitng and to contract TrA while performing marches.           PT Education - 11/27/14 0845    Education provided Yes   Education Details PT discussed progress on SOT and provided handout of first test and last test. Discussed d/c next visit.   Person(s) Educated Patient   Methods Explanation;Handout   Comprehension Returned demonstration;Verbalized understanding          PT Short Term Goals - 11/01/14 1622    PT SHORT TERM GOAL #1   Title same as LTGs.            PT Long Term Goals - 11/06/14 1314    PT LONG TERM GOAL #1   Title Pt will be IND in HEP to decr. dizziness, improve balance and safety during amb. Target date: 11/29/14.   Status On-going   PT LONG TERM GOAL #2   Title Pt will improve DGI from 18/24 to >/=20/24 to decr. falls risk. Target date: 11/29/14.   Status On-going   PT LONG TERM GOAL #3   Title Pt will amb. 600' over even/uneven terrain, while performing head turns, at MOD I level and no AD to improve functional mobillity. Target date: 11/29/14.   Status On-going   PT LONG TERM GOAL #4   Title Pt will report dizziness at its worse is 2/10 vs. 4-5/10 in order to amb. safely and perform ADLs safey. Target date: 11/29/14.   Status On-going               Plan - 11/27/14 0846    Clinical Impression Statement Pt demonstrated progress, as SOT composite score improved from 61 to 72, and preference improved to above normal limits vs. below normal limits indicating pt is able to determine which balance system to use during different sensory conditions. Continue with POC.   Pt will benefit from skilled therapeutic intervention in order to improve on the following deficits Abnormal gait;Decreased endurance;Postural dysfunction;Impaired flexibility;Decreased range of motion;Dizziness;Decreased mobility;Decreased balance;Decreased knowledge of use of DME   Rehab Potential Good   PT Frequency 2x / week   PT Duration 4 weeks   PT Treatment/Interventions ADLs/Self Care Home Management;Neuromuscular re-education;Biofeedback;Canalith Repostioning;Vestibular;Manual techniques;Balance training;Therapeutic exercise;Therapeutic activities;Functional mobility training;Gait training;Stair training;Patient/family education;DME Instruction   PT Next Visit Plan  Begin to check goals and d/c next session.   PT Home Exercise Plan Gaze stab. and stretch/balance and isometric cx ext. HEP.   Consulted and Agree with Plan of Care Patient         Problem List Patient Active Problem List   Diagnosis Date Noted  . DVT (deep venous thrombosis) ([place]) 10/12/2013  . Arthritis, senescent 10/12/2013  . Urinary retention 10/12/2013  . Heterozygous factor V Leiden mutation ([place]) 10/12/2013  . FH: pulmonary embolism 10/12/2013    Diane Roach Ruotolo L 11/27/2014, 8:48 AM  [facility] [address] [place], [place], [address] Phone: [phone]   Fax:  [phone]  Name: Diane Roach MRN: [id number] Date of Birth: 1930/11/12    Geoffry Paradise, PT,DPT 11/27/2014 8:48 AM Phone: [phone] Fax: [phone]

## 2014-11-30 ENCOUNTER — Ambulatory Visit: Payer: Medicare Other

## 2014-11-30 DIAGNOSIS — R2681 Unsteadiness on feet: Secondary | ICD-10-CM

## 2014-11-30 DIAGNOSIS — R42 Dizziness and giddiness: Secondary | ICD-10-CM | POA: Diagnosis not present

## 2014-11-30 DIAGNOSIS — R269 Unspecified abnormalities of gait and mobility: Secondary | ICD-10-CM | POA: Diagnosis not present

## 2014-11-30 NOTE — Therapy (Signed)
[facility] [address] [place], [place], [address] Phone: [phone]   Fax:  [phone]  Physical Therapy Treatment  Patient Details  Name: Casady Voshell MRN: [id number] Date of Birth: [birth year] No Data Recorded  Encounter Date: 11/30/2014      PT End of Session - 11/30/14 1227    Visit Number 9   Number of Visits 9   Date for PT Re-Evaluation 12/01/14   Authorization Type G-code every 10th visit.   PT Start Time 1145   PT Stop Time 1209   PT Time Calculation (min) 24 min   Activity Tolerance Patient tolerated treatment well   Behavior During Therapy WFL for tasks assessed/performed      Past Medical History  Diagnosis Date  . Vitamin D deficiency   . Vitamin B deficiency   . DVT (deep venous thrombosis) (HCC)     left lower extremity  . Heterozygous factor V Leiden mutation ([place])   . Heart murmur     was told years ago she had a murmur but no one mentions it now.  . Frequent UTI     hx of   . GERD (gastroesophageal reflux disease)   . History of hiatal hernia   . Arthritis   . Cancer ([place])     skin cancer (removed)  . Blood dyscrasia     low platelets  . Intermittent self-catheterization of bladder [facility])     chronic urinary retention, sees urologist    Past Surgical History  Procedure Laterality Date  . Bunionectomy Bilateral   . Hernia repair    . Cataract extraction Left 1999    with lens implant  . Cholecystectomy    . Appendectomy    . Tonsillectomy    . Abdominal hysterectomy    . Joint replacement Right 2007    hip  . Colonoscopy w/ polypectomy    . Microlaryngoscopy w/vocal cord injection N/A 04/30/2014    Procedure: MICROLARYNGOSCOPY WITH VOCAL CORD INJECTION;  Surgeon: Melida Quitter, MD;  Location: [facility];  Service: ENT;  Laterality: N/A;  Micro Direct Laryngoscopy with Prolaryn injection/jet ventilation    There were no vitals filed for this visit.  Visit Diagnosis:   Abnormality of gait  Dizziness and giddiness  Unsteadiness      Subjective Assessment - 11/30/14 1148    Subjective Pt reported she was on a bus trip yesterday to [facility] and it made her a little dizzy.  Pt feels that head "heaviness" is a little incr. after yesterday.    Pertinent History DVT, L cataract removed but has R cataract   Patient Stated Goals Eliminate imbalance   Currently in Pain? Yes                         [facility] Adult PT Treatment/Exercise - 11/30/14 0001    Ambulation/Gait   Ambulation/Gait Yes   Ambulation/Gait Assistance 6: Modified independent (Device/Increase time)   Ambulation/Gait Assistance Details Slight increased in time when amb. over grassy terrain but no LOB or postural sway noted during dynamic gait activities.   Ambulation Distance (Feet) 650 Feet   Assistive device None   Gait Pattern Step-through pattern;Decreased stride length;Decreased trunk rotation   Ambulation Surface Level;Indoor   Standardized Balance Assessment   Standardized Balance Assessment Dynamic Gait Index   Dynamic Gait Index   Level Surface Normal   Change in Gait Speed Normal   Gait with Horizontal Head Turns Mild  Impairment   Gait with Vertical Head Turns Normal   Gait and Pivot Turn Mild Impairment   Step Over Obstacle Normal   Step Around Obstacles Normal   Steps Normal   Total Score 22           Self Care:     PT Education - 12/11/2014 1227    Education provided Yes   Education Details PT reviewed HEP and reiterated stretches should be gentle and not forced. PT also printed off last SOT results and explained each condition and results again, per pt request. PT discussed goal progress and d/c. Pt agreeable.   Person(s) Educated Patient   Methods Explanation   Comprehension Verbalized understanding          PT Short Term Goals - 11/01/14 1622    PT SHORT TERM GOAL #1   Title same as LTGs.           PT Long Term Goals - 12/11/14  1230    PT LONG TERM GOAL #1   Title Pt will be IND in HEP to decr. dizziness, improve balance and safety during amb. Target date: 11/29/14.   Status Achieved   PT LONG TERM GOAL #2   Title Pt will improve DGI from 18/24 to >/=20/24 to decr. falls risk. Target date: 11/29/14.   Status Achieved   PT LONG TERM GOAL #3   Title Pt will amb. 600' over even/uneven terrain, while performing head turns, at MOD I level and no AD to improve functional mobillity. Target date: 11/29/14.   Status Achieved   PT LONG TERM GOAL #4   Title Pt will report dizziness at its worse is 2/10 vs. 4-5/10 in order to amb. safely and perform ADLs safey. Target date: 11/29/14.   Baseline 3/10 at worst   Status Partially Met               Plan - 12-11-14 1228    Clinical Impression Statement Pt met LTGs 1, 2, 3 and partially met LTG 4. Pt discharging today. Please see d/c summary for details.          G-Codes - 2014-12-11 1230    Functional Assessment Tool Used DGI: 22/24, dizziness at its worse: 3/10.   Functional Limitation Mobility: Walking and moving around   Mobility: Walking and Moving Around Goal Status [phone]) At least 1 percent but less than 20 percent impaired, limited or restricted   Mobility: Walking and Moving Around Discharge Status [id number]) At least 1 percent but less than 20 percent impaired, limited or restricted      Problem List Patient Active Problem List   Diagnosis Date Noted  . DVT (deep venous thrombosis) ([place]) 10/12/2013  . Arthritis, senescent 10/12/2013  . Urinary retention 10/12/2013  . Heterozygous factor V Leiden mutation ([place]) 10/12/2013  . FH: pulmonary embolism 10/12/2013    Keilin Gamboa L 12-11-2014, 12:35 PM  [facility] [address] [place], [place], [address] Phone: [phone]   Fax:  [phone]  Name: Falecia Vannatter MRN: [id number] Date of Birth: Dec 16, 1930  PHYSICAL THERAPY DISCHARGE  SUMMARY  Visits from Start of Care: 9  Current functional level related to goals / functional outcomes:     PT Long Term Goals - 12-11-14 1230    PT LONG TERM GOAL #1   Title Pt will be IND in HEP to decr. dizziness, improve balance and safety during amb. Target date: 11/29/14.   Status Achieved   PT LONG TERM  GOAL #2   Title Pt will improve DGI from 18/24 to >/=20/24 to decr. falls risk. Target date: 11/29/14.   Status Achieved   PT LONG TERM GOAL #3   Title Pt will amb. 600' over even/uneven terrain, while performing head turns, at MOD I level and no AD to improve functional mobillity. Target date: 11/29/14.   Status Achieved   PT LONG TERM GOAL #4   Title Pt will report dizziness at its worse is 2/10 vs. 4-5/10 in order to amb. safely and perform ADLs safey. Target date: 11/29/14.   Baseline 3/10 at worst   Status Partially Met        Remaining deficits: Pt reports intermittent "unsteadiness" during head turns when amb. And after long bus ride.   Education / Equipment: HEP  Plan: Patient agrees to discharge.  Patient goals were met. Patient is being discharged due to meeting the stated rehab goals.  ?????       Geoffry Paradise, PT,DPT 11/30/2014 12:35 PM Phone: [phone] Fax: [phone]

## 2014-12-28 ENCOUNTER — Encounter: Payer: Self-pay | Admitting: Family Medicine

## 2014-12-28 ENCOUNTER — Ambulatory Visit (INDEPENDENT_AMBULATORY_CARE_PROVIDER_SITE_OTHER): Payer: Medicare Other | Admitting: Family Medicine

## 2014-12-28 VITALS — BP 145/84 | HR 94 | Temp 98.4°F | Ht 64.0 in | Wt 162.0 lb

## 2014-12-28 DIAGNOSIS — J209 Acute bronchitis, unspecified: Secondary | ICD-10-CM | POA: Diagnosis not present

## 2014-12-28 MED ORDER — AZITHROMYCIN 250 MG PO TABS: ORAL_TABLET | ORAL | Status: DC

## 2014-12-28 NOTE — Progress Notes (Signed)
Pre visit review using our clinic review tool, if applicable. No additional management support is needed unless otherwise documented below in the visit note. 

## 2014-12-28 NOTE — Progress Notes (Signed)
   Subjective:    Patient ID: Diane Roach, female    DOB: 12/08/1930, 81 y.o.   MRN: [id number]  HPI Here for 3 days of low grade fever, chest congestion and coughing up brown sputum. No chest pain. On Mucinex.    Review of Systems  Constitutional: Positive for fever.  HENT: Positive for congestion. Negative for postnasal drip.   Eyes: Negative.   Respiratory: Positive for cough and chest tightness. Negative for wheezing.   Cardiovascular: Negative.        Objective:   Physical Exam  Constitutional: She appears well-developed and well-nourished.  HENT:  Right Ear: External ear normal.  Left Ear: External ear normal.  Nose: Nose normal.  Mouth/Throat: Oropharynx is clear and moist.  Eyes: Conjunctivae are normal.  Pulmonary/Chest: Effort normal. No respiratory distress. She has no wheezes. She has no rales.  scattered rhonchi   Lymphadenopathy:    She has no cervical adenopathy.          Assessment & Plan:  Bronchitis, treat with a Zpack and Delsym

## 2014-12-31 ENCOUNTER — Ambulatory Visit: Payer: Medicare Other | Admitting: Family Medicine

## 2015-01-15 ENCOUNTER — Ambulatory Visit: Payer: Medicare Other | Admitting: Family Medicine

## 2015-02-05 ENCOUNTER — Other Ambulatory Visit: Payer: Self-pay

## 2015-02-05 DIAGNOSIS — Z1231 Encounter for screening mammogram for malignant neoplasm of breast: Secondary | ICD-10-CM

## 2015-02-11 ENCOUNTER — Ambulatory Visit (INDEPENDENT_AMBULATORY_CARE_PROVIDER_SITE_OTHER): Payer: Medicare Other | Admitting: Family Medicine

## 2015-02-11 ENCOUNTER — Encounter: Payer: Self-pay | Admitting: Family Medicine

## 2015-02-11 VITALS — BP 138/70 | HR 94 | Temp 98.3°F | Ht 64.0 in | Wt 162.0 lb

## 2015-02-11 DIAGNOSIS — R42 Dizziness and giddiness: Secondary | ICD-10-CM

## 2015-02-11 DIAGNOSIS — R490 Dysphonia: Secondary | ICD-10-CM | POA: Diagnosis not present

## 2015-02-11 DIAGNOSIS — E785 Hyperlipidemia, unspecified: Secondary | ICD-10-CM | POA: Diagnosis not present

## 2015-02-11 NOTE — Patient Instructions (Signed)
BEFORE YOU LEAVE: -lab appointment in 2 months -follow up with Dr. Maudie Mercury in 6 months  We recommend the following healthy lifestyle measures: - eat a healthy whole foods diet consisting of regular small meals composed of vegetables, fruits, beans, nuts, seeds, healthy meats such as white chicken and fish and whole grains.  - avoid sweets, white starchy foods, fried foods, fast food, processed foods, sodas, red meet and other fattening foods.  - get a least 150-300 minutes of aerobic exercise per week.

## 2015-02-11 NOTE — Progress Notes (Signed)
Pre visit review using our clinic review tool, if applicable. No additional management support is needed unless otherwise documented below in the visit note. 

## 2015-02-11 NOTE — Progress Notes (Signed)
HPI:  Follow up  HLD: -lifestyle recs last visit and advise 3 month f/u -some exercise and doing Ileene Musa -wants to check labs in a few months -no CP, SOB, DOE  Vertigo: -completed vestibular rehab, met goals, doing well -reports therapy was great and is doing great -no falls or vertigo  Hoarseness: -chronic, seeing Dr. Redmond Baseman in ENT for this -has ENT follow up next week -stable  ROS: See pertinent positives and negatives per HPI.  Past Medical History  Diagnosis Date  . Vitamin D deficiency   . Vitamin B deficiency   . DVT (deep venous thrombosis) (HCC)     left lower extremity  . Heterozygous factor V Leiden mutation ([place])   . Heart murmur     was told years ago she had a murmur but no one mentions it now.  . Frequent UTI     hx of   . GERD (gastroesophageal reflux disease)   . History of hiatal hernia   . Arthritis   . Cancer ([place])     skin cancer (removed)  . Blood dyscrasia     low platelets  . Intermittent self-catheterization of bladder [facility])     chronic urinary retention, sees urologist  . Vertigo     2016    Past Surgical History  Procedure Laterality Date  . Bunionectomy Bilateral   . Hernia repair    . Cataract extraction Left 1999    with lens implant  . Cholecystectomy    . Appendectomy    . Tonsillectomy    . Abdominal hysterectomy    . Joint replacement Right 2007    hip  . Colonoscopy w/ polypectomy    . Microlaryngoscopy w/vocal cord injection N/A 04/30/2014    Procedure: MICROLARYNGOSCOPY WITH VOCAL CORD INJECTION;  Surgeon: Melida Quitter, MD;  Location: [place];  Service: ENT;  Laterality: N/A;  Micro Direct Laryngoscopy with Prolaryn injection/jet ventilation    Family History  Problem Relation Age of Onset  . Heart disease Mother     chf  . Cancer Father     died at age 26    Social History   Social History  . Marital Status: Widowed    Spouse Name: N/A  . Number of Children: N/A  . Years of Education: N/A   Social  History Main Topics  . Smoking status: Never Smoker   . Smokeless tobacco: Never Used  . Alcohol Use: 0.0 oz/week    0 Standard drinks or equivalent per week     Comment: glass on a rare occ  . Drug Use: No  . Sexual Activity: Not Asked   Other Topics Concern  . None   Social History Narrative   Work or School: volunteers with music group      Home Situation: lives in [place], religious      Lifestyle: active at residence, walking once per week, diet is ok              Current outpatient prescriptions:  .  acetaminophen (TYLENOL) 500 MG tablet, Take 500 mg by mouth every 6 (six) hours as needed (foot pain)., Disp: , Rfl:  .  aspirin EC 81 MG tablet, Take 81 mg by mouth daily., Disp: , Rfl:  .  B Complex Vitamins (B COMPLEX PO), Take 1 tablet by mouth daily. , Disp: , Rfl:  .  Cholecalciferol (VITAMIN D PO), 2,000 Units daily. , Disp: , Rfl:  .  Flaxseed, Linseed, (FLAX SEED OIL PO), Take by mouth., Disp: , Rfl:  .  Loratadine (ALAVERT PO), Take by mouth., Disp: , Rfl:  .  meclizine (ANTIVERT) 25 MG tablet, Take 25 mg by mouth as needed (vertigo)., Disp: , Rfl:   EXAM:  Filed Vitals:   02/11/15 1027  BP: 138/70  Pulse: 94  Temp: 98.3 F (36.8 C)    Body mass index is 27.79 kg/(m^2).  GENERAL: vitals reviewed and listed above, alert, oriented, appears well hydrated and in no acute distress  HEENT: atraumatic, conjunttiva clear, no obvious abnormalities on inspection of external nose and ears  NECK: no obvious masses on inspection  LUNGS: clear to auscultation bilaterally, no wheezes, rales or rhonchi, good air movement  CV: HRRR, no peripheral edema  MS: moves all extremities without noticeable abnormality  PSYCH: pleasant and cooperative, no obvious depression or anxiety  ASSESSMENT AND PLAN:  Discussed the following assessment and plan:  Hyperlipemia - Plan: Lipid  Panel  Vertigo  Hoarseness  -lifestyle recs -doing well -check labs in a few months per her wishes -follow up 6 months -Patient advised to return or notify a doctor immediately if symptoms worsen or persist or new concerns arise.  Patient Instructions  BEFORE YOU LEAVE: -lab appointment in 2 months -follow up with Dr. Maudie Mercury in 6 months  We recommend the following healthy lifestyle measures: - eat a healthy whole foods diet consisting of regular small meals composed of vegetables, fruits, beans, nuts, seeds, healthy meats such as white chicken and fish and whole grains.  - avoid sweets, white starchy foods, fried foods, fast food, processed foods, sodas, red meet and other fattening foods.  - get a least 150-300 minutes of aerobic exercise per week.       Colin Benton R.

## 2015-02-12 DIAGNOSIS — R490 Dysphonia: Secondary | ICD-10-CM | POA: Diagnosis not present

## 2015-02-15 ENCOUNTER — Ambulatory Visit: Payer: Medicare Other | Admitting: Family Medicine

## 2015-03-12 ENCOUNTER — Ambulatory Visit
Admission: RE | Admit: 2015-03-12 | Discharge: 2015-03-12 | Disposition: A | Discharge status: Home / Self Care | Payer: Medicare Other | Source: Ambulatory Visit

## 2015-03-12 DIAGNOSIS — Z1231 Encounter for screening mammogram for malignant neoplasm of breast: Secondary | ICD-10-CM

## 2015-04-16 ENCOUNTER — Other Ambulatory Visit (INDEPENDENT_AMBULATORY_CARE_PROVIDER_SITE_OTHER): Payer: Medicare Other

## 2015-04-16 DIAGNOSIS — E785 Hyperlipidemia, unspecified: Secondary | ICD-10-CM | POA: Diagnosis not present

## 2015-04-16 LAB — LIPID PANEL
CHOLESTEROL: 216 mg/dL — AB (ref 0–200)
HDL: 51.1 mg/dL (ref >39.00)
LDL Cholesterol: 145 mg/dL — ABNORMAL HIGH (ref 0–99)
NonHDL: 164.87
TRIGLYCERIDES: 97 mg/dL (ref 0.0–149.0)
Total CHOL/HDL Ratio: 4
VLDL: 19.4 mg/dL (ref 0.0–40.0)

## 2015-05-13 ENCOUNTER — Other Ambulatory Visit: Payer: Self-pay | Admitting: Otolaryngology

## 2015-05-16 ENCOUNTER — Encounter (HOSPITAL_COMMUNITY)
Admission: RE | Admit: 2015-05-16 | Discharge: 2015-05-16 | Disposition: A | Discharge status: Home / Self Care | Payer: Medicare Other | Source: Ambulatory Visit | Attending: Otolaryngology | Admitting: Otolaryngology

## 2015-05-16 ENCOUNTER — Encounter (HOSPITAL_COMMUNITY): Payer: Self-pay

## 2015-05-16 DIAGNOSIS — I739 Peripheral vascular disease, unspecified: Secondary | ICD-10-CM | POA: Insufficient documentation

## 2015-05-16 DIAGNOSIS — D6851 Activated protein C resistance: Secondary | ICD-10-CM | POA: Diagnosis not present

## 2015-05-16 DIAGNOSIS — R490 Dysphonia: Secondary | ICD-10-CM | POA: Insufficient documentation

## 2015-05-16 DIAGNOSIS — Z01818 Encounter for other preprocedural examination: Secondary | ICD-10-CM | POA: Diagnosis not present

## 2015-05-16 DIAGNOSIS — K219 Gastro-esophageal reflux disease without esophagitis: Secondary | ICD-10-CM | POA: Diagnosis not present

## 2015-05-16 DIAGNOSIS — Z86718 Personal history of other venous thrombosis and embolism: Secondary | ICD-10-CM | POA: Insufficient documentation

## 2015-05-16 DIAGNOSIS — Z7982 Long term (current) use of aspirin: Secondary | ICD-10-CM | POA: Diagnosis not present

## 2015-05-16 DIAGNOSIS — Z01812 Encounter for preprocedural laboratory examination: Secondary | ICD-10-CM | POA: Diagnosis not present

## 2015-05-16 DIAGNOSIS — Z79899 Other long term (current) drug therapy: Secondary | ICD-10-CM | POA: Insufficient documentation

## 2015-05-16 HISTORY — DX: Peripheral vascular disease, unspecified: I73.9

## 2015-05-16 LAB — BASIC METABOLIC PANEL
Anion gap: 10 (ref 5–15)
BUN: 15 mg/dL (ref 6–20)
CHLORIDE: 104 mmol/L (ref 101–111)
CO2: 28 mmol/L (ref 22–32)
Calcium: 9.3 mg/dL (ref 8.9–10.3)
Creatinine, Ser: 0.69 mg/dL (ref 0.44–1.00)
GFR calc Af Amer: >60 mL/min (ref >60)
GFR calc non Af Amer: >60 mL/min (ref >60)
GLUCOSE: 94 mg/dL (ref 65–99)
POTASSIUM: 3.9 mmol/L (ref 3.5–5.1)
SODIUM: 142 mmol/L (ref 135–145)

## 2015-05-16 LAB — CBC
HCT: 43.3 % (ref 36.0–46.0)
Hemoglobin: 13.7 g/dL (ref 12.0–15.0)
MCH: 26.2 pg (ref 26.0–34.0)
MCHC: 31.6 g/dL (ref 30.0–36.0)
MCV: 83 fL (ref 78.0–100.0)
PLATELETS: 157 10*3/uL (ref 150–400)
RBC: 5.22 MIL/uL — ABNORMAL HIGH (ref 3.87–5.11)
RDW: 14.4 % (ref 11.5–15.5)
WBC: 5.4 10*3/uL (ref 4.0–10.5)

## 2015-05-16 NOTE — Pre-Procedure Instructions (Signed)
Diane Roach  05/16/2015      [facility] - [place], [place] - [id number] [name] [address] Phone: [phone] Fax: [phone]    Your procedure is scheduled on  Friday  05/24/15  Report to [facility] Admitting at 830 A.M.  Call this number if you have problems the morning of surgery:  [phone]   Remember:  Do not eat food or drink liquids after midnight.  Take these medicines the morning of surgery with A SIP OF WATER  TYLENOL IF NEEDED        (STOP ASPIRIN, NO IBUPROFEN/ ADVIL/ MOTRIN, GOODY POWDERS/ BC'S, HERBAL MEDICINES)   Do not wear jewelry, make-up or nail polish.  Do not wear lotions, powders, or perfumes.  You may wear deodorant.  Do not shave 48 hours prior to surgery.  Men may shave face and neck.  Do not bring valuables to the hospital.  [facility] is not responsible for any belongings or valuables.  Contacts, dentures or bridgework may not be worn into surgery.  Leave your suitcase in the car.  After surgery it may be brought to your room.  For patients admitted to the hospital, discharge time will be determined by your treatment team.  Patients discharged the day of surgery will not be allowed to drive home.   Name and phone number of your driver:   Special instructions:  [facility] - Preparing for Surgery  Before surgery, you can play an important role.  Because skin is not sterile, your skin needs to be as free of germs as possible.  You can reduce the number of germs on you skin by washing with CHG (chlorahexidine gluconate) soap before surgery.  CHG is an antiseptic cleaner which kills germs and bonds with the skin to continue killing germs even after washing.  Please DO NOT use if you have an allergy to CHG or antibacterial soaps.  If your skin becomes reddened/irritated stop using the CHG and inform your nurse when you arrive at Short Stay.  Do not shave (including legs and  underarms) for at least 48 hours prior to the first CHG shower.  You may shave your face.  Please follow these instructions carefully:   1.  Shower with CHG Soap the night before surgery and the                                morning of Surgery.  2.  If you choose to wash your hair, wash your hair first as usual with your       normal shampoo.  3.  After you shampoo, rinse your hair and body thoroughly to remove the                      Shampoo.  4.  Use CHG as you would any other liquid soap.  You can apply chg directly       to the skin and wash gently with scrungie or a clean washcloth.  5.  Apply the CHG Soap to your body ONLY FROM THE NECK DOWN.        Do not use on open wounds or open sores.  Avoid contact with your eyes,       ears, mouth and genitals (private parts).  Wash genitals (private parts)       with your normal soap.  6.  Wash thoroughly, paying special attention to the area where your surgery        will be performed.  7.  Thoroughly rinse your body with warm water from the neck down.  8.  DO NOT shower/wash with your normal soap after using and rinsing off       the CHG Soap.  9.  Pat yourself dry with a clean towel.            10.  Wear clean pajamas.            11.  Place clean sheets on your bed the night of your first shower and do not        sleep with pets.  Day of Surgery  Do not apply any lotions/deoderants the morning of surgery.  Please wear clean clothes to the hospital/surgery center.    Please read over the following fact sheets that you were given. Pain Booklet, Coughing and Deep Breathing and Surgical Site Infection Prevention

## 2015-05-17 NOTE — Progress Notes (Signed)
Anesthesia Chart Review: Patient is a 82 year old female scheduled for micro direct laryngoscopy with vocal fold Prolaryn injection/Jet Venturi ventilation on 05/24/15 by Dr. Redmond Baseman. DX: Dysphonia.  History includes non-smoker, heterozygous factor V Leiden mutation, right infrapopliteal DVT 03/16/13, thrombocytopenia, murmur, GERD, hiatal hernia, chronic urinary retention (intermittent self cath), vertigo, PVD, cholecystectomy, appendectomy, tonsillectomy, hysterectomy, right THA, microlaryngoscopy 04/30/14. PCP is listed as Dr. Colin Benton. Urologist is Dr. Wendy Poet.   Meds include ASA 81mg , loratadine, meclizine, vitamins B and D, flax see oil.  Preoperative labs noted. PLT count 157K.  If no acute changes then I anticipate that she can proceed as planned.  [facility] Short Stay Center/Anesthesiology Phone [phone] 05/17/2015 2:12 PM

## 2015-05-24 ENCOUNTER — Ambulatory Visit (HOSPITAL_COMMUNITY)
Admission: RE | Admit: 2015-05-24 | Discharge: 2015-05-24 | Disposition: A | Discharge status: Home / Self Care | Payer: Medicare Other | Source: Ambulatory Visit | Attending: Otolaryngology | Admitting: Otolaryngology

## 2015-05-24 ENCOUNTER — Encounter (HOSPITAL_COMMUNITY): Payer: Self-pay | Admitting: General Practice

## 2015-05-24 ENCOUNTER — Ambulatory Visit (HOSPITAL_COMMUNITY): Payer: Medicare Other | Admitting: Vascular Surgery

## 2015-05-24 ENCOUNTER — Encounter (HOSPITAL_COMMUNITY)
Admission: RE | Disposition: A | Discharge status: Home / Self Care | Payer: Self-pay | Source: Ambulatory Visit | Attending: Otolaryngology

## 2015-05-24 ENCOUNTER — Ambulatory Visit (HOSPITAL_COMMUNITY): Payer: Medicare Other | Admitting: Certified Registered Nurse Anesthetist

## 2015-05-24 DIAGNOSIS — Z85828 Personal history of other malignant neoplasm of skin: Secondary | ICD-10-CM | POA: Diagnosis not present

## 2015-05-24 DIAGNOSIS — M1990 Unspecified osteoarthritis, unspecified site: Secondary | ICD-10-CM | POA: Diagnosis not present

## 2015-05-24 DIAGNOSIS — Z7982 Long term (current) use of aspirin: Secondary | ICD-10-CM | POA: Diagnosis not present

## 2015-05-24 DIAGNOSIS — Z96641 Presence of right artificial hip joint: Secondary | ICD-10-CM | POA: Diagnosis not present

## 2015-05-24 DIAGNOSIS — R490 Dysphonia: Secondary | ICD-10-CM | POA: Diagnosis not present

## 2015-05-24 DIAGNOSIS — I739 Peripheral vascular disease, unspecified: Secondary | ICD-10-CM | POA: Diagnosis not present

## 2015-05-24 DIAGNOSIS — Z880 Allergy status to penicillin: Secondary | ICD-10-CM | POA: Insufficient documentation

## 2015-05-24 DIAGNOSIS — K219 Gastro-esophageal reflux disease without esophagitis: Secondary | ICD-10-CM | POA: Diagnosis not present

## 2015-05-24 DIAGNOSIS — Z86718 Personal history of other venous thrombosis and embolism: Secondary | ICD-10-CM | POA: Diagnosis not present

## 2015-05-24 HISTORY — PX: MICROLARYNGOSCOPY W/VOCAL CORD INJECTION: SHX2665

## 2015-05-24 SURGERY — MICROLARYNGOSCOPY, WITH VOCAL CORD INJECTION
Anesthesia: General | Site: Mouth | Laterality: Bilateral

## 2015-05-24 MED ORDER — SUCCINYLCHOLINE CHLORIDE 20 MG/ML IJ SOLN
INTRAMUSCULAR | Status: DC | PRN
  Administered 2015-05-24: 100 mg via INTRAVENOUS

## 2015-05-24 MED ORDER — LIDOCAINE 2% (20 MG/ML) 5 ML SYRINGE
INTRAMUSCULAR | Status: AC
  Filled 2015-05-24: qty 10

## 2015-05-24 MED ORDER — ONDANSETRON HCL 4 MG/2ML IJ SOLN
INTRAMUSCULAR | Status: DC | PRN
  Administered 2015-05-24: 4 mg via INTRAVENOUS

## 2015-05-24 MED ORDER — PROPOFOL 500 MG/50ML IV EMUL
INTRAVENOUS | Status: DC | PRN
  Administered 2015-05-24: 125 ug/kg/min via INTRAVENOUS

## 2015-05-24 MED ORDER — PROPOFOL 10 MG/ML IV BOLUS
INTRAVENOUS | Status: AC
  Filled 2015-05-24: qty 20

## 2015-05-24 MED ORDER — PROPOFOL 10 MG/ML IV BOLUS
INTRAVENOUS | Status: DC | PRN
  Administered 2015-05-24: 120 mg via INTRAVENOUS
  Administered 2015-05-24: 30 mg via INTRAVENOUS

## 2015-05-24 MED ORDER — LACTATED RINGERS IV SOLN
INTRAVENOUS | Status: DC
  Administered 2015-05-24: 09:00:00 via INTRAVENOUS

## 2015-05-24 MED ORDER — PHENYLEPHRINE HCL 10 MG/ML IJ SOLN
10.0000 mg | INTRAMUSCULAR | Status: DC | PRN
  Administered 2015-05-24: 20 ug/min via INTRAVENOUS

## 2015-05-24 MED ORDER — ROCURONIUM BROMIDE 50 MG/5ML IV SOLN
INTRAVENOUS | Status: AC
  Filled 2015-05-24: qty 1

## 2015-05-24 MED ORDER — HYDROCODONE-ACETAMINOPHEN 5-325 MG PO TABS: 1.0000 | ORAL_TABLET | Freq: Four times a day (QID) | ORAL | Status: DC | PRN

## 2015-05-24 MED ORDER — FENTANYL CITRATE (PF) 100 MCG/2ML IJ SOLN
INTRAMUSCULAR | Status: DC | PRN
  Administered 2015-05-24: 100 ug via INTRAVENOUS
  Administered 2015-05-24: 50 ug via INTRAVENOUS

## 2015-05-24 MED ORDER — LIDOCAINE HCL (CARDIAC) 20 MG/ML IV SOLN
INTRAVENOUS | Status: DC | PRN
  Administered 2015-05-24: 60 mg via INTRAVENOUS

## 2015-05-24 MED ORDER — FENTANYL CITRATE (PF) 250 MCG/5ML IJ SOLN
INTRAMUSCULAR | Status: AC
  Filled 2015-05-24: qty 5

## 2015-05-24 MED ORDER — 0.9 % SODIUM CHLORIDE (POUR BTL) OPTIME
TOPICAL | Status: DC | PRN
  Administered 2015-05-24: 1000 mL

## 2015-05-24 MED ORDER — TRIAMCINOLONE ACETONIDE 40 MG/ML IJ SUSP
INTRAMUSCULAR | Status: AC
  Filled 2015-05-24: qty 5

## 2015-05-24 MED ORDER — EPINEPHRINE HCL (NASAL) 0.1 % NA SOLN
NASAL | Status: AC
  Filled 2015-05-24: qty 30

## 2015-05-24 SURGICAL SUPPLY — 31 items
CANISTER SUCTION 2500CC (MISCELLANEOUS) ×2 IMPLANT
CONT SPEC 4OZ CLIKSEAL STRL BL (MISCELLANEOUS) IMPLANT
COVER MAYO STAND STRL (DRAPES) ×2 IMPLANT
COVER TABLE BACK 60X90 (DRAPES) ×2 IMPLANT
CRADLE DONUT ADULT HEAD (MISCELLANEOUS) IMPLANT
DRAPE PROXIMA HALF (DRAPES) ×2 IMPLANT
GAUZE SPONGE 4X4 12PLY STRL (GAUZE/BANDAGES/DRESSINGS) IMPLANT
GAUZE SPONGE 4X4 16PLY XRAY LF (GAUZE/BANDAGES/DRESSINGS) ×2 IMPLANT
GLOVE BIO SURGEON STRL SZ7.5 (GLOVE) ×2 IMPLANT
GLOVE BIOGEL PI IND STRL 8.5 (GLOVE) ×2 IMPLANT
GLOVE BIOGEL PI INDICATOR 8.5 (GLOVE) ×2
GLOVE SURG SS PI 8.0 STRL IVOR (GLOVE) ×4 IMPLANT
GOWN STRL REUS W/ TWL LRG LVL3 (GOWN DISPOSABLE) IMPLANT
GOWN STRL REUS W/ TWL XL LVL3 (GOWN DISPOSABLE) ×2 IMPLANT
GOWN STRL REUS W/TWL LRG LVL3 (GOWN DISPOSABLE)
GOWN STRL REUS W/TWL XL LVL3 (GOWN DISPOSABLE) ×2
GUARD TEETH (MISCELLANEOUS) ×2 IMPLANT
KIT BASIN OR (CUSTOM PROCEDURE TRAY) ×2 IMPLANT
KIT PROLARN PLUS GEL W/NDL (Prosthesis and Implant ENT) ×2 IMPLANT
KIT ROOM TURNOVER OR (KITS) ×2 IMPLANT
NEEDLE HYPO 25GX1X1/2 BEV (NEEDLE) IMPLANT
NEEDLE TRANS ORAL INJECTION (NEEDLE) IMPLANT
NS IRRIG 1000ML POUR BTL (IV SOLUTION) ×2 IMPLANT
PAD ARMBOARD 7.5X6 YLW CONV (MISCELLANEOUS) ×2 IMPLANT
PATTIES SURGICAL .5 X1 (DISPOSABLE) IMPLANT
PATTIES SURGICAL .5 X3 (DISPOSABLE) IMPLANT
SOLUTION ANTI FOG 6CC (MISCELLANEOUS) ×2 IMPLANT
SURGILUBE 2OZ TUBE FLIPTOP (MISCELLANEOUS) IMPLANT
TOWEL OR 17X24 6PK STRL BLUE (TOWEL DISPOSABLE) ×2 IMPLANT
TUBE CONNECTING 12X1/4 (SUCTIONS) ×2 IMPLANT
WATER STERILE IRR 1000ML POUR (IV SOLUTION) IMPLANT

## 2015-05-24 NOTE — Anesthesia Postprocedure Evaluation (Signed)
Anesthesia Post Note  Patient: Diane Roach  Procedure(s) Performed: Procedure(s) (LRB): Micro Direct Laryngoscopy with Vocal Fold Prolaryn Injection/Jet Venturi Ventilation (Bilateral)  Patient location during evaluation: PACU Anesthesia Type: General Level of consciousness: awake Pain management: pain level controlled Vital Signs Assessment: post-procedure vital signs reviewed and stable Respiratory status: spontaneous breathing Cardiovascular status: stable Anesthetic complications: no    Last Vitals:  Filed Vitals:   05/24/15 1130 05/24/15 1135  BP:  145/74  Pulse: 80 77  Temp:    Resp: 16 15    Last Pain:  Filed Vitals:   05/24/15 1140  PainSc: 0-No pain                 EDWARDS,Rachelanne Whidby

## 2015-05-24 NOTE — Anesthesia Preprocedure Evaluation (Addendum)
Anesthesia Evaluation  Patient identified by MRN, date of birth, ID band Patient awake    Reviewed: Allergy & Precautions, NPO status , Patient's Chart, lab work & pertinent test results  Airway Mallampati: II  TM Distance: >3 FB Neck ROM: Full    Dental   Pulmonary neg pulmonary ROS,    breath sounds clear to auscultation       Cardiovascular + Peripheral Vascular Disease   Rhythm:Regular Rate:Normal     Neuro/Psych    GI/Hepatic Neg liver ROS, hiatal hernia, GERD  ,  Endo/Other  negative endocrine ROS  Renal/GU negative Renal ROS     Musculoskeletal   Abdominal   Peds  Hematology   Anesthesia Other Findings   Reproductive/Obstetrics                            Anesthesia Physical Anesthesia Plan  ASA: III  Anesthesia Plan: General   Post-op Pain Management:    Induction: Intravenous  Airway Management Planned: Oral ETT  Additional Equipment:   Intra-op Plan:   Post-operative Plan: Possible Post-op intubation/ventilation  Informed Consent: I have reviewed the patients History and Physical, chart, labs and discussed the procedure including the risks, benefits and alternatives for the proposed anesthesia with the patient or authorized representative who has indicated his/her understanding and acceptance.   Dental advisory given  Plan Discussed with: Anesthesiologist and CRNA  Anesthesia Plan Comments:         Anesthesia Quick Evaluation

## 2015-05-24 NOTE — Transfer of Care (Signed)
Immediate Anesthesia Transfer of Care Note  Patient: Diane Roach  Procedure(s) Performed: Procedure(s): Micro Direct Laryngoscopy with Vocal Fold Prolaryn Injection/Jet Venturi Ventilation (Bilateral)  Patient Location: PACU  Anesthesia Type:General  Level of Consciousness: awake, alert , oriented and patient cooperative  Airway & Oxygen Therapy: Patient Spontanous Breathing  Post-op Assessment: Report given to RN and Post -op Vital signs reviewed and stable  Post vital signs: Reviewed and stable  Last Vitals:  Filed Vitals:   05/24/15 1119 05/24/15 1120  BP:  138/76  Pulse: 82 78  Temp: 36.5 C   Resp:  9    Last Pain: There were no vitals filed for this visit.    Patients Stated Pain Goal: 3 ([id number])  Complications: No apparent anesthesia complications

## 2015-05-24 NOTE — H&P (Signed)
Diane Roach is an 82 y.o. female.   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  Previous vocal fold injection about one year ago did not bring much benefit.  She wishes to try again and presents for repeat vocal fold injection augmentation.  Past Medical History  Diagnosis Date  . Vitamin D deficiency   . Vitamin B deficiency   . DVT (deep venous thrombosis) (HCC)     left lower extremity  . Heterozygous factor V Leiden mutation ([place])   . Heart murmur     was told years ago she had a murmur but no one mentions it now.  . Frequent UTI     hx of   . GERD (gastroesophageal reflux disease)   . History of hiatal hernia   . Arthritis   . Cancer ([place])     skin cancer (removed)  . Blood dyscrasia     low platelets  . Intermittent self-catheterization of bladder [facility])     chronic urinary retention, sees urologist  . Vertigo     2016  . Peripheral vascular disease [facility])     Past Surgical History  Procedure Laterality Date  . Bunionectomy Bilateral   . Hernia repair    . Cataract extraction Left 1999    with lens implant  . Cholecystectomy    . Appendectomy    . Tonsillectomy    . Abdominal hysterectomy    . Joint replacement Right 2007    hip  . Colonoscopy w/ polypectomy    . Microlaryngoscopy w/vocal cord injection N/A 04/30/2014    Procedure: MICROLARYNGOSCOPY WITH VOCAL CORD INJECTION;  Surgeon: Melida Quitter, MD;  Location: [facility];  Service: ENT;  Laterality: N/A;  Micro Direct Laryngoscopy with Prolaryn injection/jet ventilation    Family History  Problem Relation Age of Onset  . Heart disease Mother     chf  . Cancer Father     died at age 17   Social History:  reports that she has never smoked. She has never used smokeless tobacco. She reports that she drinks alcohol. She reports that she does not use illicit drugs.  Allergies:  Allergies  Allergen Reactions  . Amoxicillin Nausea Only  . Penicillins Nausea Only   Has patient had a PCN reaction causing immediate rash, facial/tongue/throat swelling, SOB or lightheadedness with hypotension: Yes, loss of appetite also Has patient had a PCN reaction causing severe rash involving mucus membranes or skin necrosis: No Has patient had a PCN reaction that required hospitalization No Has patient had a PCN reaction occurring within the last 10 years:No If all of the above answers are "NO", then may proceed with Cephalosporin use.   . Prednisone Rash    Medications Prior to Admission  Medication Sig Dispense Refill  . acetaminophen (TYLENOL) 500 MG tablet Take 500 mg by mouth daily as needed (foot pain).     Marland Kitchen aspirin EC 81 MG tablet Take 81 mg by mouth daily.    . B Complex Vitamins (B COMPLEX PO) Take 1 tablet by mouth daily.     . Cholecalciferol (VITAMIN D PO) Take 2,000 Units by mouth daily.     . Flaxseed, Linseed, (FLAX SEED OIL PO) Take 1 tablet by mouth daily.     . Loratadine (ALAVERT PO) Take 1 tablet by mouth daily as needed (for allergies).     . meclizine (ANTIVERT) 25 MG tablet Take 25 mg by mouth as needed (vertigo).  No results found for this or any previous visit (from the past 48 hour(s)). No results found.  Review of Systems  All other systems reviewed and are negative.   Blood pressure 152/81, pulse 78, temperature 98.4 F (36.9 C), temperature source Oral, resp. rate 20, height 5' 3.5" (1.613 m), weight 72.122 kg (159 lb), SpO2 94 %. Physical Exam  Constitutional: She is oriented to person, place, and time. She appears well-developed and well-nourished. No distress.  HENT:  Head: Normocephalic and atraumatic.  Right Ear: External ear normal.  Left Ear: External ear normal.  Nose: Nose normal.  Mouth/Throat: Oropharynx is clear and moist.  Fairly clear voice but pitch breaks at times with higher pitches.  Eyes: Conjunctivae and EOM are normal. Pupils are equal, round, and reactive to light.  Neck: Normal range of motion.  Neck supple.  Cardiovascular: Normal rate.   Respiratory: Effort normal.  Musculoskeletal: Normal range of motion.  Neurological: She is alert and oriented to person, place, and time. No cranial nerve deficit.  Skin: Skin is warm and dry.  Psychiatric: She has a normal mood and affect. Her behavior is normal. Judgment and thought content normal.     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  Melida Quitter, MD 05/24/2015, 10:26 AM

## 2015-05-24 NOTE — Brief Op Note (Signed)
05/24/2015  11:00 AM  PATIENT:  Diane Roach  82 y.o. female  PRE-OPERATIVE DIAGNOSIS:  dysphonia  POST-OPERATIVE DIAGNOSIS:  dysphonia  PROCEDURE:  Procedure(s): Micro Direct Laryngoscopy with Vocal Fold Prolaryn Injection/Jet Venturi Ventilation (Bilateral)  SURGEON:  Surgeon(s) and Role:    * Melida Quitter, MD - Primary  PHYSICIAN ASSISTANT:   ASSISTANTS: none   ANESTHESIA:   general  EBL:     BLOOD ADMINISTERED:none  DRAINS: none   LOCAL MEDICATIONS USED:  NONE  SPECIMEN:  No Specimen  DISPOSITION OF SPECIMEN:  N/A  COUNTS:  YES  TOURNIQUET:  * No tourniquets in log *  DICTATION: .Other Dictation: Dictation Number [id number]  PLAN OF CARE: Discharge to home after PACU  PATIENT DISPOSITION:  PACU - hemodynamically stable.   Delay start of Pharmacological VTE agent (>24hrs) due to surgical blood loss or risk of bleeding: no

## 2015-05-25 NOTE — Op Note (Signed)
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  PROCEDURE:  Suspended microdirect laryngoscopy with bilateral Prolaryn vocal cord injections with jet ventilation.  SURGEON:  Onnie Graham, M.D.  ANESTHESIA:  General jet Venturi ventilation.  COMPLICATIONS:  None.  INDICATION:  The patient is an 82 year old female who has had dysphonia particularly affecting singing but also her voice in the last couple of years.  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  In discussing options this year, she wished to try again with another injection and presents to the operating room for surgical management.  FINDINGS:  The vocal folds were healthy appearing with no lesions and looks symmetric.  There was a bit of somewhat bowed appearance to the vocal folds.  A 0.4 mL of Prolaryn was injected in each vocal fold.  DESCRIPTION OF PROCEDURE:  The patient was identified in the holding room and informed consent having been obtained including discussion of risks, benefits, alternatives, the patient was brought to the operative suite and put on the operating table in supine position.  Anesthesia was induced and the patient was maintained via mask ventilation.  The eyes were taped closed and the bed was turned 90 degrees from anesthesia.  A tooth guard was placed over the upper teeth and a Storz laryngoscope was then placed into the supraglottic position and suspended on a Mayo  stand using a Lewy arm.  Jet ventilation was then initiated.  A preoperative photograph was made with a 0-degree telescope.  Under the operating microscope, the Prolaryn was then injected in each vocal fold starting posteriorly on each side with 0.3 mL going into each vocal fold laterally and then an additional 0.1 mL more anteriorly in each side laterally.  This resulted in a symmetric and good medialization. Postoperative photograph was made with a 0-degree telescope.  The vocal folds were then sprayed with topical lidocaine and the laryngoscope was then taken out of suspension and removed from the patient's mouth while suctioning the airway.  The tooth guard was removed, and she was returned to mask ventilation successfully.  She was turned back to Anesthesia for wake-up and moved to recovery room in stable condition.     Onnie Graham, MD     DDB/MEDQ  D:  05/24/2015  T:  05/25/2015  Job:  [id number]  cc:   Dr. Rolena Infante' office

## 2015-05-27 ENCOUNTER — Encounter (HOSPITAL_COMMUNITY): Payer: Self-pay | Admitting: Otolaryngology

## 2015-06-07 DIAGNOSIS — R490 Dysphonia: Secondary | ICD-10-CM | POA: Diagnosis not present

## 2015-07-16 ENCOUNTER — Encounter: Payer: Self-pay | Admitting: Family Medicine

## 2015-07-16 ENCOUNTER — Ambulatory Visit (INDEPENDENT_AMBULATORY_CARE_PROVIDER_SITE_OTHER): Payer: Medicare Other | Admitting: Family Medicine

## 2015-07-16 VITALS — BP 118/68 | HR 95 | Temp 97.7°F | Ht 63.5 in | Wt 156.9 lb

## 2015-07-16 DIAGNOSIS — R490 Dysphonia: Secondary | ICD-10-CM

## 2015-07-16 DIAGNOSIS — E785 Hyperlipidemia, unspecified: Secondary | ICD-10-CM | POA: Diagnosis not present

## 2015-07-16 DIAGNOSIS — R42 Dizziness and giddiness: Secondary | ICD-10-CM | POA: Diagnosis not present

## 2015-07-16 DIAGNOSIS — R339 Retention of urine, unspecified: Secondary | ICD-10-CM | POA: Diagnosis not present

## 2015-07-16 NOTE — Progress Notes (Signed)
Pre visit review using our clinic review tool, if applicable. No additional management support is needed unless otherwise documented below in the visit note. 

## 2015-07-16 NOTE — Progress Notes (Signed)
HPI:   Follow up:  Diane Roach is a pleasant 82 year old here for a follow-up visit. She has past medical history of hyperlipidemia, DVT with factor V Leiden deficiency, intermittent BPPV, bladder retention and vocal cord dysfunction here for follow-up. She is adamantly against taking pain medication for the cholesterol as did not tolerate a statin in the past. She prefers not to recheck this today. She did lose a few pounds with the [employer], but did not continue this diet. No regular aerobic or formal exercise. She occasionally has flares of the vertigo, but is able to manage these well at home with the exercises she learned with vestibular rehabilitation. Feeling well otherwise without any complaints today.  ROS: See pertinent positives and negatives per HPI.  Past Medical History  Diagnosis Date  . Vitamin D deficiency   . Vitamin B deficiency   . DVT (deep venous thrombosis) ([place])     left lower extremity once in 2015 after long car ride, refused chronic anticoagulation  . Heterozygous factor V Leiden mutation ([place])   . Heart murmur     was told years ago she had a murmur but no one mentions it now.  . Frequent UTI     hx of   . GERD (gastroesophageal reflux disease)   . History of hiatal hernia   . Arthritis   . Cancer ([place])     skin cancer (removed)  . Blood dyscrasia     low platelets  . Intermittent self-catheterization of bladder [facility])     chronic urinary retention, sees urologist  . Vertigo     2016  . Peripheral vascular disease ([place])   . Hoarseness     sees [facility]  . FH: pulmonary embolism 10/12/2013    Past Surgical History  Procedure Laterality Date  . Bunionectomy Bilateral   . Hernia repair    . Cataract extraction Left 1999    with lens implant  . Cholecystectomy    . Appendectomy    . Tonsillectomy    . Abdominal hysterectomy    . Joint replacement Right 2007    hip  . Colonoscopy w/ polypectomy    . Microlaryngoscopy w/vocal  cord injection N/A 04/30/2014    Procedure: MICROLARYNGOSCOPY WITH VOCAL CORD INJECTION;  Surgeon: Melida Quitter, MD;  Location: [facility];  Service: ENT;  Laterality: N/A;  Micro Direct Laryngoscopy with Prolaryn injection/jet ventilation  . Microlaryngoscopy w/vocal cord injection Bilateral 05/24/2015    Procedure: Micro Direct Laryngoscopy with Vocal Fold Prolaryn Injection/Jet Venturi Ventilation;  Surgeon: Melida Quitter, MD;  [facility];  Service: ENT;  Laterality: Bilateral;    Family History  Problem Relation Age of Onset  . Heart disease Mother     chf  . Cancer Father     died at age 62    Social History   Social History  . Marital Status: Widowed    Spouse Name: N/A  . Number of Children: N/A  . Years of Education: N/A   Social History Main Topics  . Smoking status: Never Smoker   . Smokeless tobacco: Never Used  . Alcohol Use: 0.0 oz/week    0 Standard drinks or equivalent per week     Comment: glass on a rare occ  . Drug Use: No  . Sexual Activity: Not Asked   Other Topics Concern  . None   Social History Narrative   Work or School: volunteers with music group      Home Situation: lives  [facility] [address]      Spiritual Beliefs: Baptist, religious      Lifestyle: active at residence, walking once per week, diet is ok              Current outpatient prescriptions:  .  acetaminophen (TYLENOL) 500 MG tablet, Take 500 mg by mouth daily as needed (foot pain). , Disp: , Rfl:  .  aspirin EC 81 MG tablet, Take 81 mg by mouth daily., Disp: , Rfl:  .  B Complex Vitamins (B COMPLEX PO), Take 1 tablet by mouth daily. , Disp: , Rfl:  .  Cholecalciferol (VITAMIN D PO), Take 2,000 Units by mouth daily. , Disp: , Rfl:  .  Flaxseed, Linseed, (FLAX SEED OIL PO), Take 1 tablet by mouth daily. , Disp: , Rfl:  .  HYDROcodone-acetaminophen (NORCO/VICODIN) 5-325 MG tablet, Take 1 tablet by mouth every 6 (six) hours as needed for moderate pain., Disp: 12 tablet, Rfl: 0 .   Loratadine (ALAVERT PO), Take 1 tablet by mouth daily as needed (for allergies). , Disp: , Rfl:  .  meclizine (ANTIVERT) 25 MG tablet, Take 25 mg by mouth as needed (vertigo)., Disp: , Rfl:   EXAM:  Filed Vitals:   07/16/15 1014  BP: 118/68  Pulse: 95  Temp: 97.7 F (36.5 C)    Body mass index is 27.35 kg/(m^2).  GENERAL: vitals reviewed and listed above, alert, oriented, appears well hydrated and in no acute distress  HEENT: atraumatic, conjunttiva clear, no obvious abnormalities on inspection of external nose and ears  NECK: no obvious masses on inspection  LUNGS: clear to auscultation bilaterally, no wheezes, rales or rhonchi, good air movement  CV: HRRR, no peripheral edema  MS: moves all extremities without noticeable abnormality  PSYCH: pleasant and cooperative, no obvious depression or anxiety  ASSESSMENT AND PLAN:  Discussed the following assessment and plan:  Hyperlipemia  Hoarseness  Urinary retention  Vertigo  -Lifestyle recommendations and treatment options for hyperlipidemia discussed, she is up to her nonpharmacological interventions due to her history of intolerance to statins -Offered vestibular rehabilitation therapy again if persistent vertigo issues, she was they're mild resolve with him treatment -Medicare wellness exam in 3-4 months -Patient advised to return or notify a doctor immediately if symptoms worsen or persist or new concerns arise.  Patient Instructions  BEFORE YOU LEAVE: -follow up: 3-4 months for medicare annual wellness exam  We recommend the following healthy lifestyle measures: - eat a healthy whole foods diet consisting of regular small meals composed of vegetables, fruits, beans, nuts, seeds, healthy meats such as white chicken and fish and whole grains.  - avoid sweets, white starchy foods, fried foods, fast food, processed foods, sodas, red meet and other fattening foods.  - get a least 150-300 minutes of aerobic exercise  per week.        Colin Benton R.

## 2015-07-16 NOTE — Patient Instructions (Signed)
BEFORE YOU LEAVE: -follow up: 3-4 months for medicare annual wellness exam  We recommend the following healthy lifestyle measures: - eat a healthy whole foods diet consisting of regular small meals composed of vegetables, fruits, beans, nuts, seeds, healthy meats such as white chicken and fish and whole grains.  - avoid sweets, white starchy foods, fried foods, fast food, processed foods, sodas, red meet and other fattening foods.  - get a least 150-300 minutes of aerobic exercise per week.

## 2015-10-16 NOTE — Progress Notes (Signed)
Medicare Annual Preventive Care Visit  (initial annual wellness or annual wellness exam)  Concerns and/or follow up today:  Diane Roach is a pleasant and strongly opinionated 82 year old here for preventive care visit. She has past medical history of hyperlipidemia, DVT with factor V Leiden deficiency, intermittent BPPV, bladder retention. She is adamantly against taking medication for the cholesterol as did not tolerate a statin in the past. She preferred not to recheck in the past and does not want to check now. Wants to check blood counts though as report a history of low platelets and rare fatigue. She did lose a few pounds with the [employer], but did not continue this diet. Has started senior exercises 2 days per week. She occasionally has flares of the vertigo, but is able to manage these well at home with the exercises she learned with vestibular rehabilitation. She plans to see [facility] for hip and knee OA. Had hip replacement about 10 years ago. Has pain in these joints with stairs. Feeling well otherwise without any complaints today.  ROS: negative for report of fevers, unintentional weight loss, vision changes, vision loss, hearing loss or change, chest pain, sob, hemoptysis, melena, hematochezia, hematuria, genital discharge or lesions, falls, bleeding or bruising, loc, thoughts of suicide or self harm, memory loss  1.) Patient-completed health risk assessment  - completed and reviewed, see scanned documentation  2.) Review of Medical History: -PMH, PSH, Family History and current specialty and care providers reviewed and updated and listed below  - see scanned in document in chart and below  Past Medical History:  Diagnosis Date  . Arthritis   . Blood dyscrasia    low platelets  . Cancer ([place])    skin cancer (removed)  . DVT (deep venous thrombosis) ([place])    left lower extremity once in 2015 after long car ride, refused chronic anticoagulation  . FH:  pulmonary embolism 10/12/2013  . Frequent UTI    hx of   . GERD (gastroesophageal reflux disease)   . Heart murmur    was told years ago she had a murmur but no one mentions it now.  Marland Kitchen Heterozygous factor V Leiden mutation ([place])   . History of hiatal hernia   . Hoarseness    sees [facility]  . Intermittent self-catheterization of bladder [facility])    chronic urinary retention, sees urologist  . Peripheral vascular disease ([place])   . Vertigo    2016  . Vitamin B deficiency   . Vitamin D deficiency     Past Surgical History:  Procedure Laterality Date  . ABDOMINAL HYSTERECTOMY    . APPENDECTOMY    . BUNIONECTOMY Bilateral   . CATARACT EXTRACTION Left 1999   with lens implant  . CHOLECYSTECTOMY    . COLONOSCOPY W/ POLYPECTOMY    . HERNIA REPAIR    . JOINT REPLACEMENT Right 2007   hip  . MICROLARYNGOSCOPY W/VOCAL CORD INJECTION N/A 04/30/2014   Procedure: MICROLARYNGOSCOPY WITH VOCAL CORD INJECTION;  Surgeon: Melida Quitter, MD;  Location: [facility];  Service: ENT;  Laterality: N/A;  Micro Direct Laryngoscopy with Prolaryn injection/jet ventilation  . MICROLARYNGOSCOPY W/VOCAL CORD INJECTION Bilateral 05/24/2015   Procedure: Micro Direct Laryngoscopy with Vocal Fold Prolaryn Injection/Jet Venturi Ventilation;  Surgeon: Melida Quitter, MD;  Location: [facility];  Service: ENT;  Laterality: Bilateral;  . TONSILLECTOMY      Social History   Social History  . Marital status: Widowed    Spouse name: N/A  . Number of  children: N/A  . Years of education: N/A   Occupational History  . Not on file.   Social History Main Topics  . Smoking status: Never Smoker  . Smokeless tobacco: Never Used  . Alcohol use 0.0 oz/week     Comment: glass on a rare occ  . Drug use: No  . Sexual activity: Not on file   Other Topics Concern  . Not on file   Social History Narrative   Work or School: volunteers with music group      Home Situation: lives [facility] [place] [address]      Spiritual Beliefs:  Baptist, religious      Lifestyle: active at residence, walking once per week, diet is ok             Family History  Problem Relation Age of Onset  . Heart disease Mother     chf  . Cancer Father     died at age 16    Current Outpatient Prescriptions on File Prior to Visit  Medication Sig Dispense Refill  . acetaminophen (TYLENOL) 500 MG tablet Take 500 mg by mouth daily as needed (foot pain).     Marland Kitchen aspirin EC 81 MG tablet Take 81 mg by mouth daily.    . Cholecalciferol (VITAMIN D PO) Take 2,000 Units by mouth daily.     . Flaxseed, Linseed, (FLAX SEED OIL PO) Take 1 tablet by mouth daily.     Marland Kitchen HYDROcodone-acetaminophen (NORCO/VICODIN) 5-325 MG tablet Take 1 tablet by mouth every 6 (six) hours as needed for moderate pain. 12 tablet 0  . Loratadine (ALAVERT PO) Take 1 tablet by mouth daily as needed (for allergies).     . meclizine (ANTIVERT) 25 MG tablet Take 25 mg by mouth as needed (vertigo).     No current facility-administered medications on file prior to visit.      3.) Review of functional ability and level of safety:  Any difficulty hearing?  NO  History of falling?  NO  Any trouble with IADLs - using a phone, using transportation, grocery shopping, preparing meals, doing housework, doing laundry, taking medications and managing money? NO  Advance Directives? Yes  See summary of recommendations in Patient Instructions below.  4.) Physical Exam Vitals:   10/17/15 0826  BP: 120/70  Pulse: 76  Temp: 97.6 F (36.4 C)   Estimated body mass index is 26.54 kg/m as calculated from the following:   Height as of this encounter: 5' 3.75" (1.619 m).   Weight as of this encounter: 153 lb 6.4 oz (69.6 kg).  EKG (optional): deferred  General: alert, appear well hydrated and in no acute distress  HEENT: visual acuity grossly intact  CV: HRRR  Lungs: CTA bilaterally  Psych: pleasant and cooperative, no obvious depression or anxiety  Cognitive function  grossly intact  See patient instructions for recommendations.  Education and counseling regarding the above review of health provided with a plan for the following: -see scanned patient completed form for further details -fall prevention strategies discussed  -healthy lifestyle discussed -importance and resources for completing advanced directives discussed -see patient instructions below for any other recommendations provided  4)The following written screening schedule of preventive measures were reviewed with assessment and plan made per below, orders and patient instructions:  Alcohol screening - done     Obesity Screening and counseling - done     STI screening (Hep C if born [birth year]) - done     Tobacco Screening -  done       Pneumococcal (PPSV23 -one dose after 64, one before if risk factors), influenza yearly and hepatitis B vaccines (if high risk - end stage renal disease, IV drugs, homosexual men, live in home for mentally retarded, hemophilia receiving factors) ASSESSMENT/PLAN: done      Screening mammograph (yearly if >40) ASSESSMENT/PLAN: done 02/2015      Screening Pap smear/pelvic exam (q2 years) ASSESSMENT/PLAN: n/a      Colorectal cancer screening (FOBT yearly or flex sig q4y or colonoscopy q10y or barium enema q4y) ASSESSMENT/PLAN: done 2014      Diabetes outpatient self-management training services ASSESSMENT/PLAN: n/a      Bone mass measurements(covered q2y if indicated - estrogen def, osteoporosis, hyperparathyroid, vertebral abnormalities, osteoporosis or steroids) ASSESSMENT/PLAN: done 07/2014 - normal      Screening for glaucoma(q1y if high risk - diabetes, FH, AA and > 50 or hispanic and > 65) ASSESSMENT/PLAN: n/a      Medical nutritional therapy for individuals with diabetes or renal disease ASSESSMENT/PLAN: n/a      Cardiovascular screening blood tests (lipids q5y) ASSESSMENT/PLAN:       Diabetes screening tests ASSESSMENT/PLAN:  today  7.) Summary:  Medicare annual wellness visit, subsequent  Chronic fatigue - Plan: CBC (no diff) -occ tired, rare and other wise feels well so doubt any underlying pathology, but will check cbc per her request, looked fine on prior checks -advised follow up if persists or worsens or new concerns  -risk factors and conditions per above assessment were discussed and treatment, recommendations and referrals were offered per documentation above and orders and patient instructions.  -flu shot advised/offered  Patient Instructions  BEFORE YOU LEAVE: -follow up: yearly and as needed -flu shot -lab  We have ordered labs or studies at this visit. It can take up to 1-2 weeks for results and processing. IF results require follow up or explanation, we will call you with instructions. Clinically stable results will be released to your [facility]. If you have not heard from [place] or cannot find your results in [facility] in 2 weeks please contact our office at [phone].  If you are not yet signed up for [facility], please consider signing up.   We recommend the following healthy lifestyle for LIFE: 1) Small portions.   Tip: eat off of a salad plate instead of a dinner plate.  Tip: It is ok to feel hungry after a meal - that likely means you ate an appropriate portion.  Tip: if you need more or a snack choose fruits, veggies and/or a handful of nuts or seeds.  2) Eat a healthy clean diet.  * Tip: Avoid (less then 1 serving per week): processed foods, sweets, sweetened drinks, white starches (rice, flour, bread, potatoes, pasta, etc), red meat, fast foods, butter  *Tip: CHOOSE instead   * 5-9 servings per day of fresh or frozen fruits and vegetables (but not corn, potatoes, bananas, canned or dried fruit)   *nuts and seeds, beans   *olives and olive oil   *small portions of lean meats such as fish and white chicken    *small portions of whole grains  3)Get at least 150 minutes of sweaty aerobic  exercise per week.  4)Reduce stress - consider counseling, meditation and relaxation to balance other aspects of your life.           Colin Benton R., DO

## 2015-10-17 ENCOUNTER — Ambulatory Visit (INDEPENDENT_AMBULATORY_CARE_PROVIDER_SITE_OTHER): Payer: Medicare Other | Admitting: Family Medicine

## 2015-10-17 ENCOUNTER — Encounter: Payer: Self-pay | Admitting: Family Medicine

## 2015-10-17 VITALS — BP 120/70 | HR 76 | Temp 97.6°F | Ht 63.75 in | Wt 153.4 lb

## 2015-10-17 DIAGNOSIS — Z0000 Encounter for general adult medical examination without abnormal findings: Secondary | ICD-10-CM | POA: Diagnosis not present

## 2015-10-17 DIAGNOSIS — Z23 Encounter for immunization: Secondary | ICD-10-CM

## 2015-10-17 DIAGNOSIS — R5382 Chronic fatigue, unspecified: Secondary | ICD-10-CM | POA: Diagnosis not present

## 2015-10-17 DIAGNOSIS — R899 Unspecified abnormal finding in specimens from other organs, systems and tissues: Secondary | ICD-10-CM

## 2015-10-17 LAB — CBC
HEMATOCRIT: 42.4 % (ref 36.0–46.0)
HEMOGLOBIN: 13.9 g/dL (ref 12.0–15.0)
MCHC: 32.7 g/dL (ref 30.0–36.0)
MCV: 82.8 fl (ref 78.0–100.0)
PLATELETS: 135 10*3/uL — AB (ref 150.0–400.0)
RBC: 5.11 Mil/uL (ref 3.87–5.11)
RDW: 14.7 % (ref 11.5–15.5)
WBC: 5.5 10*3/uL (ref 4.0–10.5)

## 2015-10-17 NOTE — Progress Notes (Signed)
Pre visit review using our clinic review tool, if applicable. No additional management support is needed unless otherwise documented below in the visit note. 

## 2015-10-17 NOTE — Patient Instructions (Signed)
BEFORE YOU LEAVE: -follow up: yearly and as needed -flu shot -lab  We have ordered labs or studies at this visit. It can take up to 1-2 weeks for results and processing. IF results require follow up or explanation, we will call you with instructions. Clinically stable results will be released to your [facility]. If you have not heard from [place] or cannot find your results in [facility] in 2 weeks please contact our office at [phone].  If you are not yet signed up for [facility], please consider signing up.   We recommend the following healthy lifestyle for LIFE: 1) Small portions.   Tip: eat off of a salad plate instead of a dinner plate.  Tip: It is ok to feel hungry after a meal - that likely means you ate an appropriate portion.  Tip: if you need more or a snack choose fruits, veggies and/or a handful of nuts or seeds.  2) Eat a healthy clean diet.  * Tip: Avoid (less then 1 serving per week): processed foods, sweets, sweetened drinks, white starches (rice, flour, bread, potatoes, pasta, etc), red meat, fast foods, butter  *Tip: CHOOSE instead   * 5-9 servings per day of fresh or frozen fruits and vegetables (but not corn, potatoes, bananas, canned or dried fruit)   *nuts and seeds, beans   *olives and olive oil   *small portions of lean meats such as fish and white chicken    *small portions of whole grains  3)Get at least 150 minutes of sweaty aerobic exercise per week.  4)Reduce stress - consider counseling, meditation and relaxation to balance other aspects of your life.

## 2015-10-18 ENCOUNTER — Telehealth: Payer: Self-pay | Admitting: Family Medicine

## 2015-10-18 NOTE — Telephone Encounter (Signed)
Pt returned your call.  

## 2015-10-18 NOTE — Addendum Note (Signed)
Addended by: Agnes Lawrence on: 10/18/2015 05:14 PM   Modules accepted: Orders

## 2015-10-18 NOTE — Telephone Encounter (Signed)
See results note. 

## 2015-11-12 DIAGNOSIS — Z961 Presence of intraocular lens: Secondary | ICD-10-CM | POA: Diagnosis not present

## 2015-11-12 DIAGNOSIS — H2511 Age-related nuclear cataract, right eye: Secondary | ICD-10-CM | POA: Diagnosis not present

## 2015-11-12 DIAGNOSIS — H524 Presbyopia: Secondary | ICD-10-CM | POA: Diagnosis not present

## 2015-11-14 DIAGNOSIS — N3942 Incontinence without sensory awareness: Secondary | ICD-10-CM | POA: Diagnosis not present

## 2015-11-14 DIAGNOSIS — N1330 Unspecified hydronephrosis: Secondary | ICD-10-CM | POA: Diagnosis not present

## 2015-11-15 ENCOUNTER — Encounter: Payer: Self-pay | Admitting: Family Medicine

## 2016-01-02 ENCOUNTER — Other Ambulatory Visit (INDEPENDENT_AMBULATORY_CARE_PROVIDER_SITE_OTHER): Payer: Medicare Other

## 2016-01-02 DIAGNOSIS — R899 Unspecified abnormal finding in specimens from other organs, systems and tissues: Secondary | ICD-10-CM | POA: Diagnosis not present

## 2016-01-03 LAB — CBC WITH DIFFERENTIAL/PLATELET
Basophils Absolute: 0 10*3/uL (ref 0.0–0.1)
Basophils Relative: 0.2 % (ref 0.0–3.0)
Eosinophils Absolute: 0.1 10*3/uL (ref 0.0–0.7)
Eosinophils Relative: 1.6 % (ref 0.0–5.0)
HCT: 39.6 % (ref 36.0–46.0)
Hemoglobin: 13 g/dL (ref 12.0–15.0)
Lymphocytes Relative: 31.2 % (ref 12.0–46.0)
Lymphs Abs: 1.3 10*3/uL (ref 0.7–4.0)
MCHC: 32.9 g/dL (ref 30.0–36.0)
MCV: 82.3 fl (ref 78.0–100.0)
Monocytes Absolute: 0.3 10*3/uL (ref 0.1–1.0)
Monocytes Relative: 7.4 % (ref 3.0–12.0)
Neutro Abs: 2.5 10*3/uL (ref 1.4–7.7)
Neutrophils Relative %: 59.6 % (ref 43.0–77.0)
Platelets: 143 10*3/uL — ABNORMAL LOW (ref 150.0–400.0)
RBC: 4.82 Mil/uL (ref 3.87–5.11)
RDW: 15.1 % (ref 11.5–15.5)
WBC: 4.2 10*3/uL (ref 4.0–10.5)

## 2016-01-09 DIAGNOSIS — G8929 Other chronic pain: Secondary | ICD-10-CM | POA: Diagnosis not present

## 2016-01-09 DIAGNOSIS — M25561 Pain in right knee: Secondary | ICD-10-CM | POA: Diagnosis not present

## 2016-01-09 DIAGNOSIS — Z96641 Presence of right artificial hip joint: Secondary | ICD-10-CM | POA: Diagnosis not present

## 2016-01-09 DIAGNOSIS — M25551 Pain in right hip: Secondary | ICD-10-CM | POA: Diagnosis not present

## 2016-01-17 DIAGNOSIS — M25551 Pain in right hip: Secondary | ICD-10-CM | POA: Diagnosis not present

## 2016-01-27 ENCOUNTER — Ambulatory Visit (INDEPENDENT_AMBULATORY_CARE_PROVIDER_SITE_OTHER): Payer: Medicare Other | Admitting: Family Medicine

## 2016-01-27 ENCOUNTER — Encounter: Payer: Self-pay | Admitting: Family Medicine

## 2016-01-27 VITALS — BP 122/82 | HR 75 | Temp 97.7°F | Ht 63.75 in | Wt 156.8 lb

## 2016-01-27 DIAGNOSIS — W19XXXA Unspecified fall, initial encounter: Secondary | ICD-10-CM | POA: Diagnosis not present

## 2016-01-27 DIAGNOSIS — T148XXA Other injury of unspecified body region, initial encounter: Secondary | ICD-10-CM

## 2016-01-27 DIAGNOSIS — M25551 Pain in right hip: Secondary | ICD-10-CM | POA: Diagnosis not present

## 2016-01-27 NOTE — Progress Notes (Addendum)
HPI:  Diane Roach is a pleasant 83 yo here for an acute visit for a bruise on her chin. She is seeing ortho for hip and knee issues ([facility]). Had MRI of hip then had mechanical fall in parking lot dec 29th. Denies any balance issues or vertigo then or since. She bumped her hand, chin, knees. She did not seek care as felt fine and feels has healed up fine, but her daughter wanted her to come get checked out. Bruise on hand and chin almost entirely gone now and feels fine. No issues with chewing or talking. No gait disturbance and knees feel fine and she sees her orthopedic specialist in a few days. No HA, neck pain, vision changes, weakness, speech or cog changes.  ROS: See pertinent positives and negatives per HPI.  Past Medical History:  Diagnosis Date  . Arthritis   . Blood dyscrasia    low platelets  . Cancer ([place])    skin cancer (removed)  . DVT (deep venous thrombosis) ([place])    left lower extremity once in 2015 after long car ride, refused chronic anticoagulation  . FH: pulmonary embolism 10/12/2013  . Frequent UTI    hx of   . GERD (gastroesophageal reflux disease)   . Heart murmur    was told years ago she had a murmur but no one mentions it now.  Marland Kitchen Heterozygous factor V Leiden mutation ([place])   . History of hiatal hernia   . Hoarseness    sees [facility]  . Intermittent self-catheterization of bladder    chronic urinary retention, sees urologist  . Peripheral vascular disease ([place])   . Vertigo    2016  . Vitamin B deficiency   . Vitamin D deficiency     Past Surgical History:  Procedure Laterality Date  . ABDOMINAL HYSTERECTOMY    . APPENDECTOMY    . BUNIONECTOMY Bilateral   . CATARACT EXTRACTION Left 1999   with lens implant  . CHOLECYSTECTOMY    . COLONOSCOPY W/ POLYPECTOMY    . HERNIA REPAIR    . JOINT REPLACEMENT Right 2007   hip  . MICROLARYNGOSCOPY W/VOCAL CORD INJECTION N/A 04/30/2014   Procedure: MICROLARYNGOSCOPY WITH VOCAL CORD INJECTION;   Surgeon: Melida Quitter, MD;  Location: [facility];  Service: ENT;  Laterality: N/A;  Micro Direct Laryngoscopy with Prolaryn injection/jet ventilation  . MICROLARYNGOSCOPY W/VOCAL CORD INJECTION Bilateral 05/24/2015   Procedure: Micro Direct Laryngoscopy with Vocal Fold Prolaryn Injection/Jet Venturi Ventilation;  Surgeon: Melida Quitter, MD;  Location: [facility];  Service: ENT;  Laterality: Bilateral;  . TONSILLECTOMY      Family History  Problem Relation Age of Onset  . Heart disease Mother     chf  . Cancer Father     died at age 66    Social History   Social History  . Marital status: Widowed    Spouse name: N/A  . Number of children: N/A  . Years of education: N/A   Social History Main Topics  . Smoking status: Never Smoker  . Smokeless tobacco: Never Used  . Alcohol use 0.0 oz/week     Comment: glass on a rare occ  . Drug use: No  . Sexual activity: Not Asked   Other Topics Concern  . None   Social History Narrative   Work or School: volunteers with music group      Home Situation: lives in [place], religious  Lifestyle: active at residence, walking once per week, diet is ok              Current Outpatient Prescriptions:  .  acetaminophen (TYLENOL) 500 MG tablet, Take 500 mg by mouth daily as needed (foot pain). , Disp: , Rfl:  .  aspirin EC 81 MG tablet, Take 81 mg by mouth daily., Disp: , Rfl:  .  Cholecalciferol (VITAMIN D PO), Take 2,000 Units by mouth daily. , Disp: , Rfl:  .  Flaxseed, Linseed, (FLAX SEED OIL PO), Take 1 tablet by mouth daily. , Disp: , Rfl:  .  HYDROcodone-acetaminophen (NORCO/VICODIN) 5-325 MG tablet, Take 1 tablet by mouth every 6 (six) hours as needed for moderate pain., Disp: 12 tablet, Rfl: 0 .  Loratadine (ALAVERT PO), Take 1 tablet by mouth daily as needed (for allergies). , Disp: , Rfl:  .  meclizine (ANTIVERT) 25 MG tablet, Take 25 mg by mouth as needed (vertigo)., Disp: , Rfl:  .   vitamin B-12 (CYANOCOBALAMIN) 1000 MCG tablet, Take 1,000 mcg by mouth daily., Disp: , Rfl:   EXAM:  Vitals:   01/27/16 0816  BP: 122/82  Pulse: 75  Temp: 97.7 F (36.5 C)    Body mass index is 27.13 kg/m.  GENERAL: vitals reviewed and listed above, alert, oriented, appears well hydrated and in no acute distress  HEENT: atraumatic except for small hematoma chin with very mild resolving bruise, mildly ttp over chin, PERRLA, vision grossly intact, conjunttiva clear, no obvious abnormalities on inspection of external nose and ears  NECK: no obvious masses on inspection  LUNGS: clear to auscultation bilaterally, no wheezes, rales or rhonchi, good air movement  CV: HRRR, no peripheral edema  MS: moves all extremities without noticeable abnormality, normal gait  PSYCH/NEURO: pleasant and cooperative, no obvious depression or anxiety, speech and thought processing grossly intact, finger to nose normal,CN II-XII grossly intact  ASSESSMENT AND PLAN:  Discussed the following assessment and plan:  Bruise  Fall, initial encounter  Pain of right hip joint - s/p hip replacement - seeing [facility]  -seems to be doing well with no obvious serious sequela of fall -she will see ortho about her hips and knees in follow up - but she feels no change since fall -discussed fall prevention - offered cane - she refused -Patient advised to return or notify a doctor immediately if symptoms worsen or persist or new concerns arise.  There are no Patient Instructions on file for this visit.  Colin Benton R., DO

## 2016-01-27 NOTE — Progress Notes (Signed)
Pre visit review using our clinic review tool, if applicable. No additional management support is needed unless otherwise documented below in the visit note. 

## 2016-01-30 DIAGNOSIS — Z96641 Presence of right artificial hip joint: Secondary | ICD-10-CM | POA: Diagnosis not present

## 2016-01-30 DIAGNOSIS — Z471 Aftercare following joint replacement surgery: Secondary | ICD-10-CM | POA: Diagnosis not present

## 2016-02-04 DIAGNOSIS — Z96641 Presence of right artificial hip joint: Secondary | ICD-10-CM | POA: Diagnosis not present

## 2016-02-18 ENCOUNTER — Other Ambulatory Visit: Payer: Self-pay | Admitting: Family Medicine

## 2016-02-18 DIAGNOSIS — Z1231 Encounter for screening mammogram for malignant neoplasm of breast: Secondary | ICD-10-CM

## 2016-02-21 DIAGNOSIS — M1711 Unilateral primary osteoarthritis, right knee: Secondary | ICD-10-CM | POA: Diagnosis not present

## 2016-02-28 ENCOUNTER — Encounter: Payer: Self-pay | Admitting: Family Medicine

## 2016-02-28 ENCOUNTER — Ambulatory Visit (INDEPENDENT_AMBULATORY_CARE_PROVIDER_SITE_OTHER): Payer: Medicare Other | Admitting: Family Medicine

## 2016-02-28 VITALS — BP 120/72 | HR 78 | Temp 97.7°F | Ht 63.75 in | Wt 155.0 lb

## 2016-02-28 DIAGNOSIS — IMO0002 Reserved for concepts with insufficient information to code with codable children: Secondary | ICD-10-CM

## 2016-02-28 DIAGNOSIS — H8111 Benign paroxysmal vertigo, right ear: Secondary | ICD-10-CM | POA: Diagnosis not present

## 2016-02-28 MED ORDER — MECLIZINE HCL 25 MG PO TABS: 25.0000 mg | ORAL_TABLET | Freq: Three times a day (TID) | ORAL | 0 refills | Status: DC | PRN

## 2016-02-28 NOTE — Progress Notes (Signed)
Pre visit review using our clinic review tool, if applicable. No additional management support is needed unless otherwise documented below in the visit note. 

## 2016-02-28 NOTE — Patient Instructions (Signed)
BEFORE YOU LEAVE: -follow up: 1 month  Meclizine as needed for symptoms per instructions.  Do not drive with vertigo.  -We placed a referral for you as discussed for the vestibular rehab. It usually takes about 1-2 weeks to process and schedule this referral. If you have not heard from [place] regarding this appointment in 2 weeks please contact our office.   Benign Positional Vertigo Introduction Vertigo is the feeling that you or your surroundings are moving when they are not. Benign positional vertigo is the most common form of vertigo. The cause of this condition is not serious (is benign). This condition is triggered by certain movements and positions (is positional). This condition can be dangerous if it occurs while you are doing something that could endanger you or others, such as driving. What are the causes? In many cases, the cause of this condition is not known. It may be caused by a disturbance in an area of the inner ear that helps your brain to sense movement and balance. This disturbance can be caused by a viral infection (labyrinthitis), head injury, or repetitive motion. What increases the risk? This condition is more likely to develop in:  Women.  People who are 66 years of age or older. What are the signs or symptoms? Symptoms of this condition usually happen when you move your head or your eyes in different directions. Symptoms may start suddenly, and they usually last for less than a minute. Symptoms may include:  Loss of balance and falling.  Feeling like you are spinning or moving.  Feeling like your surroundings are spinning or moving.  Nausea and vomiting.  Blurred vision.  Dizziness.  Involuntary eye movement (nystagmus). Symptoms can be mild and cause only slight annoyance, or they can be severe and interfere with daily life. Episodes of benign positional vertigo may return (recur) over time, and they may be triggered by certain movements. Symptoms may  improve over time. How is this diagnosed? This condition is usually diagnosed by medical history and a physical exam of the head, neck, and ears. You may be referred to a health care provider who specializes in ear, nose, and throat (ENT) problems (otolaryngologist) or a provider who specializes in disorders of the nervous system (neurologist). You may have additional testing, including:  MRI.  A CT scan.  Eye movement tests. Your health care provider may ask you to change positions quickly while he or she watches you for symptoms of benign positional vertigo, such as nystagmus. Eye movement may be tested with an electronystagmogram (ENG), caloric stimulation, the Dix-Hallpike test, or the roll test.  An electroencephalogram (EEG). This records electrical activity in your brain.  Hearing tests. How is this treated? Usually, your health care provider will treat this by moving your head in specific positions to adjust your inner ear back to normal. Surgery may be needed in severe cases, but this is rare. In some cases, benign positional vertigo may resolve on its own in 2-4 weeks. Follow these instructions at home: Safety  Move slowly.Avoid sudden body or head movements.  Avoid driving.  Avoid operating heavy machinery.  Avoid doing any tasks that would be dangerous to you or others if a vertigo episode would occur.  If you have trouble walking or keeping your balance, try using a cane for stability. If you feel dizzy or unstable, sit down right away.  Return to your normal activities as told by your health care provider. Ask your health care provider what activities are  safe for you. General instructions  Take over-the-counter and prescription medicines only as told by your health care provider.  Avoid certain positions or movements as told by your health care provider.  Drink enough fluid to keep your urine clear or pale yellow.  Keep all follow-up visits as told by your health  care provider. This is important. Contact a health care provider if:  You have a fever.  Your condition gets worse or you develop new symptoms.  Your family or friends notice any behavioral changes.  Your nausea or vomiting gets worse.  You have numbness or a "pins and needles" sensation. Get help right away if:  You have difficulty speaking or moving.  You are always dizzy.  You faint.  You develop severe headaches.  You have weakness in your legs or arms.  You have changes in your hearing or vision.  You develop a stiff neck.  You develop sensitivity to light. This information is not intended to replace advice given to you by your health care provider. Make sure you discuss any questions you have with your health care provider. Document Released: 10/13/2005 Document Revised: 06/13/2015 Document Reviewed: 04/30/2014  2017 Elsevier

## 2016-02-28 NOTE — Progress Notes (Signed)
HPI:  Diane Roach is a pleasant 83 yo with a known hx vertigo here for an acute visit for:  Vertigo: -started suddenly a few days ago -brief episodes of vertigo triggered by certain movements of the head to the R -no fever, HA, vision changes, vomiting, weakness, numbness  ROS: See pertinent positives and negatives per HPI.  Past Medical History:  Diagnosis Date  . Arthritis   . Blood dyscrasia    low platelets  . Cancer ([place])    skin cancer (removed)  . DVT (deep venous thrombosis) ([place])    left lower extremity once in 2015 after long car ride, refused chronic anticoagulation  . FH: pulmonary embolism 10/12/2013  . Frequent UTI    hx of   . GERD (gastroesophageal reflux disease)   . Heart murmur    was told years ago she had a murmur but no one mentions it now.  Marland Kitchen Heterozygous factor V Leiden mutation ([place])   . History of hiatal hernia   . Hoarseness    sees [facility]  . Intermittent self-catheterization of bladder    chronic urinary retention, sees urologist  . Peripheral vascular disease ([place])   . Vertigo    2016  . Vitamin B deficiency   . Vitamin D deficiency     Past Surgical History:  Procedure Laterality Date  . ABDOMINAL HYSTERECTOMY    . APPENDECTOMY    . BUNIONECTOMY Bilateral   . CATARACT EXTRACTION Left 1999   with lens implant  . CHOLECYSTECTOMY    . COLONOSCOPY W/ POLYPECTOMY    . HERNIA REPAIR    . JOINT REPLACEMENT Right 2007   hip  . MICROLARYNGOSCOPY W/VOCAL CORD INJECTION N/A 04/30/2014   Procedure: MICROLARYNGOSCOPY WITH VOCAL CORD INJECTION;  Surgeon: Melida Quitter, MD;  Location: [facility];  Service: ENT;  Laterality: N/A;  Micro Direct Laryngoscopy with Prolaryn injection/jet ventilation  . MICROLARYNGOSCOPY W/VOCAL CORD INJECTION Bilateral 05/24/2015   Procedure: Micro Direct Laryngoscopy with Vocal Fold Prolaryn Injection/Jet Venturi Ventilation;  Surgeon: Melida Quitter, MD;  Location: [facility];  Service: ENT;  Laterality: Bilateral;    . TONSILLECTOMY      Family History  Problem Relation Age of Onset  . Heart disease Mother     chf  . Cancer Father     died at age 47    Social History   Social History  . Marital status: Widowed    Spouse name: N/A  . Number of children: N/A  . Years of education: N/A   Social History Main Topics  . Smoking status: Never Smoker  . Smokeless tobacco: Never Used  . Alcohol use 0.0 oz/week     Comment: glass on a rare occ  . Drug use: No  . Sexual activity: Not Asked   Other Topics Concern  . None   Social History Narrative   Work or School: volunteers with music group      Home Situation: lives in [place], religious      Lifestyle: active at residence, walking once per week, diet is ok              Current Outpatient Prescriptions:  .  acetaminophen (TYLENOL) 500 MG tablet, Take 500 mg by mouth daily as needed (foot pain). , Disp: , Rfl:  .  aspirin EC 81 MG tablet, Take 81 mg by mouth daily., Disp: , Rfl:  .  Cholecalciferol (VITAMIN D PO), Take 2,000  Units by mouth daily. , Disp: , Rfl:  .  Flaxseed, Linseed, (FLAX SEED OIL PO), Take 1 tablet by mouth daily. , Disp: , Rfl:  .  Loratadine (ALAVERT PO), Take 1 tablet by mouth daily as needed (for allergies). , Disp: , Rfl:  .  vitamin B-12 (CYANOCOBALAMIN) 1000 MCG tablet, Take 1,000 mcg by mouth daily., Disp: , Rfl:  .  meclizine (ANTIVERT) 25 MG tablet, Take 1 tablet (25 mg total) by mouth 3 (three) times daily as needed for dizziness., Disp: 30 tablet, Rfl: 0  EXAM:  Vitals:   02/28/16 0919  BP: 120/72  Pulse: 78  Temp: 97.7 F (36.5 C)    Body mass index is 26.81 kg/m.  GENERAL: vitals reviewed and listed above, alert, oriented, appears well hydrated and in no acute distress  HEENT: atraumatic, conjunttiva clear, no obvious abnormalities on inspection of external nose and ears, normal appearance of ear canals and TMs, normal appearance  oropharynx  NECK: no obvious masses on inspection, no bruit  LUNGS: clear to auscultation bilaterally, no wheezes, rales or rhonchi, good air movement  CV: HRRR, no peripheral edema  MS: moves all extremities without noticeable abnormality  PSYCH: pleasant and cooperative, no obvious depression or anxiety, CN II-XII grossly intact, finger to nose normal, speech and thought processing grossly intact, dix hallpike + to the R  ASSESSMENT AND PLAN:  Discussed the following assessment and plan:  Positional vertigo of right ear - Plan: meclizine (ANTIVERT) 25 MG tablet, PT vestibular rehab  -we discussed possible serious and likely etiologies, workup and treatment, treatment risks and return precautions; classic hx and findings for BPPV -after this discussion, Diane Roach opted for vestibular rehab, meclizine, no driving with vertigo -follow up advised in 1 month -of course, we advised Diane Roach  to return or notify a doctor immediately if symptoms worsen or persist or new concerns arise.   Patient Instructions  BEFORE YOU LEAVE: -follow up: 1 month  Meclizine as needed for symptoms per instructions.  Do not drive with vertigo.  -We placed a referral for you as discussed for the vestibular rehab. It usually takes about 1-2 weeks to process and schedule this referral. If you have not heard from [place] regarding this appointment in 2 weeks please contact our office.   Benign Positional Vertigo Introduction Vertigo is the feeling that you or your surroundings are moving when they are not. Benign positional vertigo is the most common form of vertigo. The cause of this condition is not serious (is benign). This condition is triggered by certain movements and positions (is positional). This condition can be dangerous if it occurs while you are doing something that could endanger you or others, such as driving. What are the causes? In many cases, the cause of this condition is not known. It may be  caused by a disturbance in an area of the inner ear that helps your brain to sense movement and balance. This disturbance can be caused by a viral infection (labyrinthitis), head injury, or repetitive motion. What increases the risk? This condition is more likely to develop in:  Women.  People who are 56 years of age or older. What are the signs or symptoms? Symptoms of this condition usually happen when you move your head or your eyes in different directions. Symptoms may start suddenly, and they usually last for less than a minute. Symptoms may include:  Loss of balance and falling.  Feeling like you are spinning or moving.  Feeling like your  surroundings are spinning or moving.  Nausea and vomiting.  Blurred vision.  Dizziness.  Involuntary eye movement (nystagmus). Symptoms can be mild and cause only slight annoyance, or they can be severe and interfere with daily life. Episodes of benign positional vertigo may return (recur) over time, and they may be triggered by certain movements. Symptoms may improve over time. How is this diagnosed? This condition is usually diagnosed by medical history and a physical exam of the head, neck, and ears. You may be referred to a health care provider who specializes in ear, nose, and throat (ENT) problems (otolaryngologist) or a provider who specializes in disorders of the nervous system (neurologist). You may have additional testing, including:  MRI.  A CT scan.  Eye movement tests. Your health care provider may ask you to change positions quickly while he or she watches you for symptoms of benign positional vertigo, such as nystagmus. Eye movement may be tested with an electronystagmogram (ENG), caloric stimulation, the Dix-Hallpike test, or the roll test.  An electroencephalogram (EEG). This records electrical activity in your brain.  Hearing tests. How is this treated? Usually, your health care provider will treat this by moving your  head in specific positions to adjust your inner ear back to normal. Surgery may be needed in severe cases, but this is rare. In some cases, benign positional vertigo may resolve on its own in 2-4 weeks. Follow these instructions at home: Safety  Move slowly.Avoid sudden body or head movements.  Avoid driving.  Avoid operating heavy machinery.  Avoid doing any tasks that would be dangerous to you or others if a vertigo episode would occur.  If you have trouble walking or keeping your balance, try using a cane for stability. If you feel dizzy or unstable, sit down right away.  Return to your normal activities as told by your health care provider. Ask your health care provider what activities are safe for you. General instructions  Take over-the-counter and prescription medicines only as told by your health care provider.  Avoid certain positions or movements as told by your health care provider.  Drink enough fluid to keep your urine clear or pale yellow.  Keep all follow-up visits as told by your health care provider. This is important. Contact a health care provider if:  You have a fever.  Your condition gets worse or you develop new symptoms.  Your family or friends notice any behavioral changes.  Your nausea or vomiting gets worse.  You have numbness or a "pins and needles" sensation. Get help right away if:  You have difficulty speaking or moving.  You are always dizzy.  You faint.  You develop severe headaches.  You have weakness in your legs or arms.  You have changes in your hearing or vision.  You develop a stiff neck.  You develop sensitivity to light. This information is not intended to replace advice given to you by your health care provider. Make sure you discuss any questions you have with your health care provider. Document Released: 10/13/2005 Document Revised: 06/13/2015 Document Reviewed: 04/30/2014  2017 Elsevier      Colin Benton R.,  DO

## 2016-03-12 ENCOUNTER — Ambulatory Visit
Admission: RE | Admit: 2016-03-12 | Discharge: 2016-03-12 | Disposition: A | Discharge status: Home / Self Care | Payer: Medicare Other | Source: Ambulatory Visit | Attending: Family Medicine | Admitting: Family Medicine

## 2016-03-12 DIAGNOSIS — Z1231 Encounter for screening mammogram for malignant neoplasm of breast: Secondary | ICD-10-CM | POA: Diagnosis not present

## 2016-03-26 NOTE — Progress Notes (Signed)
HPI:  Follow up:  BPPV: -intermittent -referred for vestibular rehab recent flare 02/2016 -symptoms resolved so she did not do the therapy  HLD: -refused statin or to continue monitoring  Hx DVT and factor V Leidin/mild thrombocytopenia: -was seeing hematologist -declined further treatment -plan to recheck cbc today  OA: -s/p hip replacement remotely -has had some mild chromium and cobalt elevations per pt  And ortho is monitoring and considering revision if sig elevation -sees [facility]  Hx mild thrombocytopenia: -due for recheck cbc   ROS: See pertinent positives and negatives per HPI.  Past Medical History:  Diagnosis Date  . Arthritis   . Blood dyscrasia    low platelets  . Cancer ([place])    skin cancer (removed)  . DVT (deep venous thrombosis) ([place])    left lower extremity once in 2015 after long car ride, refused chronic anticoagulation  . FH: pulmonary embolism 10/12/2013  . Frequent UTI    hx of   . GERD (gastroesophageal reflux disease)   . Heart murmur    was told years ago she had a murmur but no one mentions it now.  Marland Kitchen Heterozygous factor V Leiden mutation ([place])   . History of hiatal hernia   . Hoarseness    sees [facility]  . Intermittent self-catheterization of bladder    chronic urinary retention, sees urologist  . Peripheral vascular disease ([place])   . Vertigo    2016  . Vitamin B deficiency   . Vitamin D deficiency     Past Surgical History:  Procedure Laterality Date  . ABDOMINAL HYSTERECTOMY    . APPENDECTOMY    . BUNIONECTOMY Bilateral   . CATARACT EXTRACTION Left 1999   with lens implant  . CHOLECYSTECTOMY    . COLONOSCOPY W/ POLYPECTOMY    . HERNIA REPAIR    . JOINT REPLACEMENT Right 2007   hip  . MICROLARYNGOSCOPY W/VOCAL CORD INJECTION N/A 04/30/2014   Procedure: MICROLARYNGOSCOPY WITH VOCAL CORD INJECTION;  Surgeon: Melida Quitter, MD;  Location: [facility];  Service: ENT;  Laterality: N/A;  Micro Direct Laryngoscopy with  Prolaryn injection/jet ventilation  . MICROLARYNGOSCOPY W/VOCAL CORD INJECTION Bilateral 05/24/2015   Procedure: Micro Direct Laryngoscopy with Vocal Fold Prolaryn Injection/Jet Venturi Ventilation;  Surgeon: Melida Quitter, MD;  Location: [facility];  Service: ENT;  Laterality: Bilateral;  . TONSILLECTOMY      Family History  Problem Relation Age of Onset  . Heart disease Mother     chf  . Cancer Father     died at age 84    Social History   Social History  . Marital status: Widowed    Spouse name: N/A  . Number of children: N/A  . Years of education: N/A   Social History Main Topics  . Smoking status: Never Smoker  . Smokeless tobacco: Never Used  . Alcohol use 0.0 oz/week     Comment: glass on a rare occ  . Drug use: No  . Sexual activity: Not Asked   Other Topics Concern  . None   Social History Narrative   Work or School: volunteers with music group      Home Situation: lives in [place], religious      Lifestyle: active at residence, walking once per week, diet is ok              Current Outpatient Prescriptions:  .  acetaminophen (TYLENOL) 500 MG tablet, Take 500  mg by mouth daily as needed (foot pain). , Disp: , Rfl:  .  aspirin EC 81 MG tablet, Take 81 mg by mouth daily., Disp: , Rfl:  .  Cholecalciferol (VITAMIN D PO), Take 2,000 Units by mouth daily. , Disp: , Rfl:  .  Flaxseed, Linseed, (FLAX SEED OIL PO), Take 1 tablet by mouth daily. , Disp: , Rfl:  .  Loratadine (ALAVERT PO), Take 1 tablet by mouth daily as needed (for allergies). , Disp: , Rfl:  .  meclizine (ANTIVERT) 25 MG tablet, Take 1 tablet (25 mg total) by mouth 3 (three) times daily as needed for dizziness., Disp: 30 tablet, Rfl: 0 .  vitamin B-12 (CYANOCOBALAMIN) 1000 MCG tablet, Take 1,000 mcg by mouth daily., Disp: , Rfl:   EXAM:  Vitals:   03/27/16 0951  BP: 118/70  Pulse: 90  Temp: 97.7 F (36.5 C)    Body mass index is 27.01  kg/m.  GENERAL: vitals reviewed and listed above, alert, oriented, appears well hydrated and in no acute distress  HEENT: atraumatic, conjunttiva clear, no obvious abnormalities on inspection of external nose and ears  NECK: no obvious masses on inspection  LUNGS: clear to auscultation bilaterally, no wheezes, rales or rhonchi, good air movement  CV: HRRR, no peripheral edema  MS: moves all extremities without noticeable abnormality  PSYCH: pleasant and cooperative, no obvious depression or anxiety  ASSESSMENT AND PLAN:  Discussed the following assessment and plan:  Vertigo  Hyperlipidemia, unspecified hyperlipidemia type  Heterozygous factor V Leiden mutation ([facility])  Thrombocytopenia ([facility]) - Plan: CBC  Heavy metal exposure - chromium and cobalt from hip replacement - seeing ortho for monitoring and management  -check cbc -doing well -ortho monitor c/c and pt considering revision -lifestyle recs -Patient advised to return or notify a doctor immediately if symptoms worsen or persist or new concerns arise.  Patient Instructions  BEFORE YOU LEAVE: -follow up: Medicare exam in September with Dr. Maudie Mercury -lab  We have ordered labs or studies at this visit. It can take up to 1-2 weeks for results and processing. IF results require follow up or explanation, we will call you with instructions. Clinically stable results will be released to your [facility]. If you have not heard from [place] or cannot find your results in [facility] in 2 weeks please contact our office at [phone].  If you are not yet signed up for [facility], please consider signing up.  WE NOW OFFER   [facility] FAST TRACK!!!  SAME DAY Appointments for ACUTE CARE  Such as: Sprains, Injuries, cuts, abrasions, rashes, muscle pain, joint pain, back pain Colds, flu, sore throats, headache, allergies, cough, fever  Ear pain, sinus and eye infections Abdominal pain, nausea, vomiting, diarrhea, upset  stomach Animal/insect bites  3 Easy Ways to Schedule: Walk-In Scheduling Call in scheduling Mychart Sign-up: [email or web address]                 Colin Benton R., DO

## 2016-03-27 ENCOUNTER — Encounter: Payer: Self-pay | Admitting: Family Medicine

## 2016-03-27 ENCOUNTER — Ambulatory Visit (INDEPENDENT_AMBULATORY_CARE_PROVIDER_SITE_OTHER): Payer: Medicare Other | Admitting: Family Medicine

## 2016-03-27 VITALS — BP 118/70 | HR 90 | Temp 97.7°F | Ht 63.75 in | Wt 156.1 lb

## 2016-03-27 DIAGNOSIS — E785 Hyperlipidemia, unspecified: Secondary | ICD-10-CM

## 2016-03-27 DIAGNOSIS — Z77018 Contact with and (suspected) exposure to other hazardous metals: Secondary | ICD-10-CM | POA: Diagnosis not present

## 2016-03-27 DIAGNOSIS — D6851 Activated protein C resistance: Secondary | ICD-10-CM | POA: Diagnosis not present

## 2016-03-27 DIAGNOSIS — R42 Dizziness and giddiness: Secondary | ICD-10-CM

## 2016-03-27 DIAGNOSIS — D696 Thrombocytopenia, unspecified: Secondary | ICD-10-CM

## 2016-03-27 LAB — CBC
HEMATOCRIT: 42.3 % (ref 36.0–46.0)
Hemoglobin: 13.8 g/dL (ref 12.0–15.0)
MCHC: 32.5 g/dL (ref 30.0–36.0)
MCV: 83.5 fl (ref 78.0–100.0)
Platelets: 141 10*3/uL — ABNORMAL LOW (ref 150.0–400.0)
RBC: 5.07 Mil/uL (ref 3.87–5.11)
RDW: 14.5 % (ref 11.5–15.5)
WBC: 5.6 10*3/uL (ref 4.0–10.5)

## 2016-03-27 NOTE — Patient Instructions (Signed)
BEFORE YOU LEAVE: -follow up: Medicare exam in September with Dr. Maudie Mercury -lab  We have ordered labs or studies at this visit. It can take up to 1-2 weeks for results and processing. IF results require follow up or explanation, we will call you with instructions. Clinically stable results will be released to your [facility]. If you have not heard from [place] or cannot find your results in [facility] in 2 weeks please contact our office at [phone].  If you are not yet signed up for [facility], please consider signing up.  WE NOW OFFER   [facility] FAST TRACK!!!  SAME DAY Appointments for ACUTE CARE  Such as: Sprains, Injuries, cuts, abrasions, rashes, muscle pain, joint pain, back pain Colds, flu, sore throats, headache, allergies, cough, fever  Ear pain, sinus and eye infections Abdominal pain, nausea, vomiting, diarrhea, upset stomach Animal/insect bites  3 Easy Ways to Schedule: Walk-In Scheduling Call in scheduling Mychart Sign-up: [email or web address]

## 2016-03-27 NOTE — Progress Notes (Signed)
Pre visit review using our clinic review tool, if applicable. No additional management support is needed unless otherwise documented below in the visit note. 

## 2016-04-09 ENCOUNTER — Other Ambulatory Visit: Payer: Medicare Other

## 2016-04-23 ENCOUNTER — Ambulatory Visit: Payer: Medicare Other | Admitting: Family Medicine

## 2016-04-27 ENCOUNTER — Ambulatory Visit (INDEPENDENT_AMBULATORY_CARE_PROVIDER_SITE_OTHER): Payer: Medicare Other | Admitting: Family Medicine

## 2016-04-27 ENCOUNTER — Encounter: Payer: Self-pay | Admitting: Family Medicine

## 2016-04-27 VITALS — BP 124/78 | HR 82 | Temp 98.3°F | Ht 63.75 in | Wt 157.1 lb

## 2016-04-27 DIAGNOSIS — H8111 Benign paroxysmal vertigo, right ear: Secondary | ICD-10-CM

## 2016-04-27 DIAGNOSIS — IMO0002 Reserved for concepts with insufficient information to code with codable children: Secondary | ICD-10-CM

## 2016-04-27 MED ORDER — MECLIZINE HCL 25 MG PO TABS: 25.0000 mg | ORAL_TABLET | Freq: Three times a day (TID) | ORAL | 0 refills | Status: DC | PRN

## 2016-04-27 NOTE — Patient Instructions (Signed)
BEFORE YOU LEAVE: -follow up: 1 month  -We placed a referral for you as discussed for the vestibular rehab for the vertigo. It usually takes about 1-2 weeks to process and schedule this referral. If you have not heard from [place] regarding this appointment in 2 weeks please contact our office.  -meclizine if needed per instructions  -do not drive with vertigo  I hope you are feeling better soon! Seek care immediately if worsening, new concerns or you are not improving with treatment.   Benign Positional Vertigo Vertigo is the feeling that you or your surroundings are moving when they are not. Benign positional vertigo is the most common form of vertigo. The cause of this condition is not serious (is benign). This condition is triggered by certain movements and positions (is positional). This condition can be dangerous if it occurs while you are doing something that could endanger you or others, such as driving. What are the causes? In many cases, the cause of this condition is not known. It may be caused by a disturbance in an area of the inner ear that helps your brain to sense movement and balance. This disturbance can be caused by a viral infection (labyrinthitis), head injury, or repetitive motion. What increases the risk? This condition is more likely to develop in:  Women.  People who are 40 years of age or older. What are the signs or symptoms? Symptoms of this condition usually happen when you move your head or your eyes in different directions. Symptoms may start suddenly, and they usually last for less than a minute. Symptoms may include:  Loss of balance and falling.  Feeling like you are spinning or moving.  Feeling like your surroundings are spinning or moving.  Nausea and vomiting.  Blurred vision.  Dizziness.  Involuntary eye movement (nystagmus). Symptoms can be mild and cause only slight annoyance, or they can be severe and interfere with daily life. Episodes of  benign positional vertigo may return (recur) over time, and they may be triggered by certain movements. Symptoms may improve over time. How is this diagnosed? This condition is usually diagnosed by medical history and a physical exam of the head, neck, and ears. You may be referred to a health care provider who specializes in ear, nose, and throat (ENT) problems (otolaryngologist) or a provider who specializes in disorders of the nervous system (neurologist). You may have additional testing, including:  MRI.  A CT scan.  Eye movement tests. Your health care provider may ask you to change positions quickly while he or she watches you for symptoms of benign positional vertigo, such as nystagmus. Eye movement may be tested with an electronystagmogram (ENG), caloric stimulation, the Dix-Hallpike test, or the roll test.  An electroencephalogram (EEG). This records electrical activity in your brain.  Hearing tests. How is this treated? Usually, your health care provider will treat this by moving your head in specific positions to adjust your inner ear back to normal. Surgery may be needed in severe cases, but this is rare. In some cases, benign positional vertigo may resolve on its own in 2-4 weeks. Follow these instructions at home: Safety   Move slowly.Avoid sudden body or head movements.  Avoid driving.  Avoid operating heavy machinery.  Avoid doing any tasks that would be dangerous to you or others if a vertigo episode would occur.  If you have trouble walking or keeping your balance, try using a cane for stability. If you feel dizzy or unstable, sit down  right away.  Return to your normal activities as told by your health care provider. Ask your health care provider what activities are safe for you. General instructions   Take over-the-counter and prescription medicines only as told by your health care provider.  Avoid certain positions or movements as told by your health care  provider.  Drink enough fluid to keep your urine clear or pale yellow.  Keep all follow-up visits as told by your health care provider. This is important. Contact a health care provider if:  You have a fever.  Your condition gets worse or you develop new symptoms.  Your family or friends notice any behavioral changes.  Your nausea or vomiting gets worse.  You have numbness or a "pins and needles" sensation. Get help right away if:  You have difficulty speaking or moving.  You are always dizzy.  You faint.  You develop severe headaches.  You have weakness in your legs or arms.  You have changes in your hearing or vision.  You develop a stiff neck.  You develop sensitivity to light. This information is not intended to replace advice given to you by your health care provider. Make sure you discuss any questions you have with your health care provider. Document Released: 10/13/2005 Document Revised: 06/13/2015 Document Reviewed: 04/30/2014 Elsevier Interactive Patient Education  2017 [employer].

## 2016-04-27 NOTE — Progress Notes (Signed)
Pre visit review using our clinic review tool, if applicable. No additional management support is needed unless otherwise documented below in the visit note. 

## 2016-04-27 NOTE — Progress Notes (Signed)
HPI:  Recurrent vertigo: -BPPV symptoms  02/2016, referred for vestibular rehab - she did not do as symptoms resolved -hx prior vertigo -reported resolution 03/27/16 -today reports: recurred acutely the last week or so -symptoms the same with brief spells vertigo with certain head movements -denies: HA, CP, palpitations, fevers, malaise, weakness, falls, numbness, speech or vision changes -meclizine helps  ROS: See pertinent positives and negatives per HPI.  Past Medical History:  Diagnosis Date  . Arthritis   . Blood dyscrasia    low platelets  . Cancer ([place])    skin cancer (removed)  . DVT (deep venous thrombosis) ([place])    left lower extremity once in 2015 after long car ride, refused chronic anticoagulation  . FH: pulmonary embolism 10/12/2013  . Frequent UTI    hx of   . GERD (gastroesophageal reflux disease)   . Heart murmur    was told years ago she had a murmur but no one mentions it now.  Marland Kitchen Heterozygous factor V Leiden mutation ([place])   . History of hiatal hernia   . Hoarseness    sees [facility]  . Intermittent self-catheterization of bladder    chronic urinary retention, sees urologist  . Peripheral vascular disease ([place])   . Vertigo    2016  . Vitamin B deficiency   . Vitamin D deficiency     Past Surgical History:  Procedure Laterality Date  . ABDOMINAL HYSTERECTOMY    . APPENDECTOMY    . BUNIONECTOMY Bilateral   . CATARACT EXTRACTION Left 1999   with lens implant  . CHOLECYSTECTOMY    . COLONOSCOPY W/ POLYPECTOMY    . HERNIA REPAIR    . JOINT REPLACEMENT Right 2007   hip  . MICROLARYNGOSCOPY W/VOCAL CORD INJECTION N/A 04/30/2014   Procedure: MICROLARYNGOSCOPY WITH VOCAL CORD INJECTION;  Surgeon: Melida Quitter, MD;  Location: [facility];  Service: ENT;  Laterality: N/A;  Micro Direct Laryngoscopy with Prolaryn injection/jet ventilation  . MICROLARYNGOSCOPY W/VOCAL CORD INJECTION Bilateral 05/24/2015   Procedure: Micro Direct Laryngoscopy with Vocal Fold  Prolaryn Injection/Jet Venturi Ventilation;  Surgeon: Melida Quitter, MD;  Location: [facility];  Service: ENT;  Laterality: Bilateral;  . TONSILLECTOMY      Family History  Problem Relation Age of Onset  . Heart disease Mother     chf  . Cancer Father     died at age 48    Social History   Social History  . Marital status: Widowed    Spouse name: N/A  . Number of children: N/A  . Years of education: N/A   Social History Main Topics  . Smoking status: Never Smoker  . Smokeless tobacco: Never Used  . Alcohol use 0.0 oz/week     Comment: glass on a rare occ  . Drug use: No  . Sexual activity: Not Asked   Other Topics Concern  . None   Social History Narrative   Work or School: volunteers with music group      Home Situation: lives in [place], religious      Lifestyle: active at residence, walking once per week, diet is ok              Current Outpatient Prescriptions:  .  acetaminophen (TYLENOL) 500 MG tablet, Take 500 mg by mouth daily as needed (foot pain). , Disp: , Rfl:  .  aspirin EC 81 MG tablet, Take 81 mg by mouth daily., Disp: ,  Rfl:  .  Cholecalciferol (VITAMIN D PO), Take 2,000 Units by mouth daily. , Disp: , Rfl:  .  Flaxseed, Linseed, (FLAX SEED OIL PO), Take 1 tablet by mouth daily. , Disp: , Rfl:  .  Loratadine (ALAVERT PO), Take 1 tablet by mouth daily as needed (for allergies). , Disp: , Rfl:  .  vitamin B-12 (CYANOCOBALAMIN) 1000 MCG tablet, Take 1,000 mcg by mouth daily., Disp: , Rfl:  .  meclizine (ANTIVERT) 25 MG tablet, Take 1 tablet (25 mg total) by mouth 3 (three) times daily as needed for dizziness., Disp: 30 tablet, Rfl: 0  EXAM:  Vitals:   04/27/16 1334  BP: 124/78  Pulse: 82  Temp: 98.3 F (36.8 C)    Body mass index is 27.18 kg/m.  GENERAL: vitals reviewed and listed above, alert, oriented, appears well hydrated and in no acute distress  HEENT: atraumatic, conjunttiva clear, no  obvious abnormalities on inspection of external nose and ears  NECK: no obvious masses on inspection  LUNGS: clear to auscultation bilaterally, no wheezes, rales or rhonchi, good air movement  CV: HRRR, no peripheral edema  MS: moves all extremities without noticeable abnormality  PSYCH/NEURO: pleasant and cooperative, no obvious depression or anxiety, gait normal, CN II-XII grossly intact, finger to nose normal, dix hallpike + to the R  ASSESSMENT AND PLAN:  Discussed the following assessment and plan:  Positional vertigo of right ear - Plan: Ambulatory referral to Physical Therapy, meclizine (ANTIVERT) 25 MG tablet  -we discussed possible serious and likely etiologies, workup and treatment, treatment risks and return precautions for vertigo - her's is most likely BPPV -after this discussion, Diane Roach opted for vestibular rehab, refill meclizine -advised no driving with vertigo -follow up advised 1 month  -of course, we advised Chelsy  to return or notify a doctor immediately if symptoms worsen or persist or new concerns arise.   Patient Instructions  BEFORE YOU LEAVE: -follow up: 1 month  -We placed a referral for you as discussed for the vestibular rehab for the vertigo. It usually takes about 1-2 weeks to process and schedule this referral. If you have not heard from [place] regarding this appointment in 2 weeks please contact our office.  -meclizine if needed per instructions  -do not drive with vertigo  I hope you are feeling better soon! Seek care immediately if worsening, new concerns or you are not improving with treatment.   Benign Positional Vertigo Vertigo is the feeling that you or your surroundings are moving when they are not. Benign positional vertigo is the most common form of vertigo. The cause of this condition is not serious (is benign). This condition is triggered by certain movements and positions (is positional). This condition can be dangerous if it occurs  while you are doing something that could endanger you or others, such as driving. What are the causes? In many cases, the cause of this condition is not known. It may be caused by a disturbance in an area of the inner ear that helps your brain to sense movement and balance. This disturbance can be caused by a viral infection (labyrinthitis), head injury, or repetitive motion. What increases the risk? This condition is more likely to develop in:  Women.  People who are 8 years of age or older. What are the signs or symptoms? Symptoms of this condition usually happen when you move your head or your eyes in different directions. Symptoms may start suddenly, and they usually last for less than a  minute. Symptoms may include:  Loss of balance and falling.  Feeling like you are spinning or moving.  Feeling like your surroundings are spinning or moving.  Nausea and vomiting.  Blurred vision.  Dizziness.  Involuntary eye movement (nystagmus). Symptoms can be mild and cause only slight annoyance, or they can be severe and interfere with daily life. Episodes of benign positional vertigo may return (recur) over time, and they may be triggered by certain movements. Symptoms may improve over time. How is this diagnosed? This condition is usually diagnosed by medical history and a physical exam of the head, neck, and ears. You may be referred to a health care provider who specializes in ear, nose, and throat (ENT) problems (otolaryngologist) or a provider who specializes in disorders of the nervous system (neurologist). You may have additional testing, including:  MRI.  A CT scan.  Eye movement tests. Your health care provider may ask you to change positions quickly while he or she watches you for symptoms of benign positional vertigo, such as nystagmus. Eye movement may be tested with an electronystagmogram (ENG), caloric stimulation, the Dix-Hallpike test, or the roll test.  An  electroencephalogram (EEG). This records electrical activity in your brain.  Hearing tests. How is this treated? Usually, your health care provider will treat this by moving your head in specific positions to adjust your inner ear back to normal. Surgery may be needed in severe cases, but this is rare. In some cases, benign positional vertigo may resolve on its own in 2-4 weeks. Follow these instructions at home: Safety   Move slowly.Avoid sudden body or head movements.  Avoid driving.  Avoid operating heavy machinery.  Avoid doing any tasks that would be dangerous to you or others if a vertigo episode would occur.  If you have trouble walking or keeping your balance, try using a cane for stability. If you feel dizzy or unstable, sit down right away.  Return to your normal activities as told by your health care provider. Ask your health care provider what activities are safe for you. General instructions   Take over-the-counter and prescription medicines only as told by your health care provider.  Avoid certain positions or movements as told by your health care provider.  Drink enough fluid to keep your urine clear or pale yellow.  Keep all follow-up visits as told by your health care provider. This is important. Contact a health care provider if:  You have a fever.  Your condition gets worse or you develop new symptoms.  Your family or friends notice any behavioral changes.  Your nausea or vomiting gets worse.  You have numbness or a "pins and needles" sensation. Get help right away if:  You have difficulty speaking or moving.  You are always dizzy.  You faint.  You develop severe headaches.  You have weakness in your legs or arms.  You have changes in your hearing or vision.  You develop a stiff neck.  You develop sensitivity to light. This information is not intended to replace advice given to you by your health care provider. Make sure you discuss any  questions you have with your health care provider. Document Released: 10/13/2005 Document Revised: 06/13/2015 Document Reviewed: 04/30/2014 Elsevier Interactive Patient Education  2017 [place]

## 2016-05-01 DIAGNOSIS — M1711 Unilateral primary osteoarthritis, right knee: Secondary | ICD-10-CM | POA: Diagnosis not present

## 2016-05-13 ENCOUNTER — Ambulatory Visit: Payer: Medicare Other | Attending: Family Medicine | Admitting: Rehabilitative and Restorative Service Providers"

## 2016-05-13 DIAGNOSIS — R42 Dizziness and giddiness: Secondary | ICD-10-CM | POA: Diagnosis not present

## 2016-05-13 DIAGNOSIS — R2689 Other abnormalities of gait and mobility: Secondary | ICD-10-CM | POA: Diagnosis not present

## 2016-05-13 DIAGNOSIS — R2681 Unsteadiness on feet: Secondary | ICD-10-CM | POA: Diagnosis not present

## 2016-05-13 DIAGNOSIS — H8111 Benign paroxysmal vertigo, right ear: Secondary | ICD-10-CM | POA: Diagnosis not present

## 2016-05-13 NOTE — Therapy (Signed)
[facility] [address] [place], [place], [address] Phone: [phone]   Fax:  [phone]  Physical Therapy Evaluation  Patient Details  Name: Diane Roach MRN: [id number] Date of Birth: 23-Sep-1930 Referring Provider: Colin Benton, DO  Encounter Date: 05/13/2016      PT End of Session - 05/13/16 1224    Visit Number 1   Number of Visits 6   Date for PT Re-Evaluation 06/12/16   Authorization Type G code every 10th visit   PT Start Time 1110   PT Stop Time 1152   PT Time Calculation (min) 42 min   Activity Tolerance Patient tolerated treatment well   Behavior During Therapy [facility] for tasks assessed/performed      Past Medical History:  Diagnosis Date  . Arthritis   . Blood dyscrasia    low platelets  . Cancer ([place])    skin cancer (removed)  . DVT (deep venous thrombosis) ([place])    left lower extremity once in 2015 after long car ride, refused chronic anticoagulation  . FH: pulmonary embolism 10/12/2013  . Frequent UTI    hx of   . GERD (gastroesophageal reflux disease)   . Heart murmur    was told years ago she had a murmur but no one mentions it now.  Marland Kitchen Heterozygous factor V Leiden mutation ([place])   . History of hiatal hernia   . Hoarseness    sees [facility]  . Intermittent self-catheterization of bladder    chronic urinary retention, sees urologist  . Peripheral vascular disease ([place])   . Vertigo    2016  . Vitamin B deficiency   . Vitamin D deficiency     Past Surgical History:  Procedure Laterality Date  . ABDOMINAL HYSTERECTOMY    . APPENDECTOMY    . BUNIONECTOMY Bilateral   . CATARACT EXTRACTION Left 1999   with lens implant  . CHOLECYSTECTOMY    . COLONOSCOPY W/ POLYPECTOMY    . HERNIA REPAIR    . JOINT REPLACEMENT Right 2007   hip  . MICROLARYNGOSCOPY W/VOCAL CORD INJECTION N/A 04/30/2014   Procedure: MICROLARYNGOSCOPY WITH VOCAL CORD INJECTION;  Surgeon: Melida Quitter, MD;   Location: [facility];  Service: ENT;  Laterality: N/A;  Micro Direct Laryngoscopy with Prolaryn injection/jet ventilation  . MICROLARYNGOSCOPY W/VOCAL CORD INJECTION Bilateral 05/24/2015   Procedure: Micro Direct Laryngoscopy with Vocal Fold Prolaryn Injection/Jet Venturi Ventilation;  Surgeon: Melida Quitter, MD;  Location: [facility];  Service: ENT;  Laterality: Bilateral;  . TONSILLECTOMY      There were no vitals filed for this visit.       Subjective Assessment - 05/13/16 1116    Subjective The patient reports h/o recurrent vertigo with return of symptoms approximately 3 weeks ago.  Her symptoms are most  noticeable when getting into bed and when turning she notes imbalance/pulling sensation.  She reports that she has other neurological issues including needing to catheterize to void, speach dysphonia.     Patient Stated Goals "I have no idea".   Currently in Pain? Yes  Some achiness in base of skull-- thinks because she cannot move as well due to vertigo.            [facility] PT Assessment - 05/13/16 1120      Assessment   Medical Diagnosis BPPV, right   Referring Provider Colin Benton, DO   Onset Date/Surgical Date --  04/2016   Prior Therapy known to our clinic from prior PT  Precautions   Precautions Fall     Restrictions   Weight Bearing Restrictions No     Balance Screen   Has the patient fallen in the past 6 months No   Has the patient had a decrease in activity level because of a fear of falling?  Yes  Moving more cautiously due to vertigo, also feels more fatig   Is the patient reluctant to leave their home because of a fear of falling?  No     Home Ecologist residence     Prior Function   Level of Independence Independent     Cognition   Overall Cognitive Status Within Functional Limits for tasks assessed     Observation/Other Assessments   Focus on Therapeutic Outcomes (FOTO)  60%   Other Surveys  Other Surveys   Dizziness Handicap  Inventory (DHI)  24%     Ambulation/Gait   Ambulation/Gait Yes   Ambulation/Gait Assistance 6: Modified independent (Device/Increase time)  slowed pace, moves en bloc   Ambulation Distance (Feet) 100 Feet   Assistive device None   Gait Pattern Step-through pattern;Decreased stride length;Decreased trunk rotation  slowed pace with en bloc movement   Ambulation Surface Level   Gait velocity 2.22 ft/sec     Standardized Balance Assessment   Standardized Balance Assessment Berg Balance Test     Berg Balance Test   Sit to Stand Able to stand without using hands and stabilize independently   Standing Unsupported Able to stand safely 2 minutes   Sitting with Back Unsupported but Feet Supported on Floor or Stool Able to sit safely and securely 2 minutes   Stand to Sit Sits safely with minimal use of hands   Transfers Able to transfer safely, minor use of hands   Standing Unsupported with Eyes Closed Able to stand 10 seconds with supervision   Standing Ubsupported with Feet Together Able to place feet together independently and stand for 1 minute with supervision   From Standing, Reach Forward with Outstretched Arm Can reach confidently >25 cm (10")   From Standing Position, Pick up Object from Floor Able to pick up shoe, needs supervision   From Standing Position, Turn to Look Behind Over each Shoulder Looks behind from both sides and weight shifts well   Turn 360 Degrees Able to turn 360 degrees safely but slowly   Standing Unsupported, Alternately Place Feet on Step/Stool Able to complete 4 steps without aid or supervision   Standing Unsupported, One Foot in Front Able to take small step independently and hold 30 seconds   Standing on One Leg Tries to lift leg/unable to hold 3 seconds but remains standing independently   Total Score 44   Berg comment: 44/56             Vestibular Assessment - 05/13/16 1123      Vestibular Assessment   General Observation Patient walks into clinic  without a device.  She has loss of balance when turning to sit on mat and has to catch herself.       Symptom Behavior   Type of Dizziness Imbalance  spinning   Frequency of Dizziness daily   Duration of Dizziness intermittent, spinning lasts for seconds   Aggravating Factors --  bed mobility, walking down the hall, turns, looking up   Relieving Factors Head stationary     Occulomotor Exam   Occulomotor Alignment Abnormal  L eye mild ptosis (chronic per patient).  Spontaneous Absent   Gaze-induced Absent   Smooth Pursuits Intact   Saccades Intact   Comment Wears trifocals     Vestibulo-Occular Reflex   VOR 1 Head Only (x 1 viewing) Able to maintain gaze, however moves slowly and provokes mild sensation of dizziness   Comment Head impulse test=positive bilaterally for large amplitude refixation saccade.     Positional Testing   Dix-Hallpike Dix-Hallpike Right;Dix-Hallpike Left   Horizontal Canal Testing Horizontal Canal Right;Horizontal Canal Left     Dix-Hallpike Right   Dix-Hallpike Right Duration 25 seconds   Dix-Hallpike Right Symptoms Upbeat, right rotatory nystagmus     Dix-Hallpike Left   Dix-Hallpike Left Duration none   Dix-Hallpike Left Symptoms No nystagmus     Horizontal Canal Right   Horizontal Canal Right Duration none   Horizontal Canal Right Symptoms Normal     Horizontal Canal Left   Horizontal Canal Left Duration none   Horizontal Canal Left Symptoms Normal                Vestibular Treatment/Exercise - 05/13/16 1226      Vestibular Treatment/Exercise   Vestibular Treatment Provided Canalith Repositioning   Canalith Repositioning Epley Manuever Right      EPLEY MANUEVER RIGHT   Number of Reps  2   Overall Response Improved Symptoms   Response Details  patient with <5 beats of nystagmus noted on second repetition of Epley's               PT Education - 05/13/16 1223    Education provided Yes   Education Details nature of  BPPV and goals for therapy   Person(s) Educated Patient   Methods Explanation   Comprehension Verbalized understanding          PT Short Term Goals - 05/13/16 1228      PT SHORT TERM GOAL #1   Title STGs=LTGs           PT Long Term Goals - 05/13/16 1228      PT LONG TERM GOAL #1   Title The patient will return demo HEP for gaze x 1 adaptation, habituation, and balance activities.   Baseline Target date 06/12/2016   Time 4   Period Weeks     PT LONG TERM GOAL #2   Title The patient will improve Berg balance scale from 44/56 to > or equal to 49/56 to demo improving steady state balance.   Baseline Target date 06/12/2016   Time 4   Period Weeks     PT LONG TERM GOAL #3   Title The patient will improve DHI from 24% to < or equal to 10% to demo improved self perception of dizziness.   Baseline Target date 06/12/2016   Time 4   Period Weeks     PT LONG TERM GOAL #4   Title The patient will have negative positional testing for BPPV to demo resolution of symptoms.   Baseline Target date 06/12/2016   Time 4   Period Weeks     PT LONG TERM GOAL #5   Title The patient will improve gait speed from 2.22 ft/sec to > or equal to 2.6 ft/sec to demo transition to "full community ambulator" classification of gait.   Baseline Target date 06/12/2016   Time 4   Period Weeks               Plan - 05/13/16 1231    Clinical Impression Statement The patient is an 83 year old  female preenting to outpatient rehab with R BPPV, diminished VOR, as well as decreased balance and slowed gait speed.  PT treated R BPPV today with Epley's and patient tolerated well.  PT to address deficits and promote improved functional mobility.   Rehab Potential Good   PT Frequency 2x / week  for one week to establish HEP, then reduce frequency to 1x/week   PT Duration 4 weeks   PT Treatment/Interventions ADLs/Self Care Home Management;Canalith Repostioning;Therapeutic activities;Therapeutic  exercise;Balance training;Neuromuscular re-education;Gait training;Functional mobility training;Patient/family education;Vestibular   PT Next Visit Plan Recheck for R BPPV, HEP for gaze x 1, corner balance/multi-sensory   Consulted and Agree with Plan of Care Patient      Patient will benefit from skilled therapeutic intervention in order to improve the following deficits and impairments:  Abnormal gait, Difficulty walking, Dizziness, Decreased balance  Visit Diagnosis: Other abnormalities of gait and mobility  BPPV (benign paroxysmal positional vertigo), right  Dizziness and giddiness  Unsteadiness on feet     Problem List Patient Active Problem List   Diagnosis Date Noted  . Heavy metal exposure 03/27/2016  . Pain of right hip joint 01/27/2016  . Hyperlipemia 02/11/2015  . Hoarseness 02/11/2015  . Arthritis, senescent 10/12/2013  . Urinary retention 10/12/2013  . Heterozygous factor V Leiden mutation ([place]) 10/12/2013  . FH: pulmonary embolism 10/12/2013    Freja Faro, PT 05/13/2016, 12:38 PM  [facility] [address] [place], [place], [address] Phone: [phone]   Fax:  [phone]  Name: Itsel Opfer MRN: [id number] Date of Birth: 1930-06-08

## 2016-05-23 NOTE — Progress Notes (Deleted)
HPI:  AWV 09/2015  Hx Vertigo, DVT, FActor V Leiden,  (declined further anticoagulation), HLD (refused tx) here for follow up. Recently referred to vestibular rehab for recurrence vertigo. Reports. Denies.   ROS: See pertinent positives and negatives per HPI.  Past Medical History:  Diagnosis Date  . Arthritis   . Blood dyscrasia    low platelets  . Cancer ([place])    skin cancer (removed)  . DVT (deep venous thrombosis) ([place])    left lower extremity once in 2015 after long car ride, refused chronic anticoagulation  . FH: pulmonary embolism 10/12/2013  . Frequent UTI    hx of   . GERD (gastroesophageal reflux disease)   . Heart murmur    was told years ago she had a murmur but no one mentions it now.  Marland Kitchen Heterozygous factor V Leiden mutation ([place])   . History of hiatal hernia   . Hoarseness    sees [facility]  . Intermittent self-catheterization of bladder    chronic urinary retention, sees urologist  . Peripheral vascular disease ([place])   . Vertigo    2016  . Vitamin B deficiency   . Vitamin D deficiency     Past Surgical History:  Procedure Laterality Date  . ABDOMINAL HYSTERECTOMY    . APPENDECTOMY    . BUNIONECTOMY Bilateral   . CATARACT EXTRACTION Left 1999   with lens implant  . CHOLECYSTECTOMY    . COLONOSCOPY W/ POLYPECTOMY    . HERNIA REPAIR    . JOINT REPLACEMENT Right 2007   hip  . MICROLARYNGOSCOPY W/VOCAL CORD INJECTION N/A 04/30/2014   Procedure: MICROLARYNGOSCOPY WITH VOCAL CORD INJECTION;  Surgeon: Melida Quitter, MD;  Location: [facility];  Service: ENT;  Laterality: N/A;  Micro Direct Laryngoscopy with Prolaryn injection/jet ventilation  . MICROLARYNGOSCOPY W/VOCAL CORD INJECTION Bilateral 05/24/2015   Procedure: Micro Direct Laryngoscopy with Vocal Fold Prolaryn Injection/Jet Venturi Ventilation;  Surgeon: Melida Quitter, MD;  Location: [facility];  Service: ENT;  Laterality: Bilateral;  . TONSILLECTOMY      Family History  Problem Relation Age of Onset  .  Heart disease Mother     chf  . Cancer Father     died at age 58    Social History   Social History  . Marital status: Widowed    Spouse name: N/A  . Number of children: N/A  . Years of education: N/A   Social History Main Topics  . Smoking status: Never Smoker  . Smokeless tobacco: Never Used  . Alcohol use 0.0 oz/week     Comment: glass on a rare occ  . Drug use: No  . Sexual activity: Not on file   Other Topics Concern  . Not on file   Social History Narrative   Work or School: volunteers with music group      Home Situation: lives in [address]      Spiritual Beliefs: [place], religious      Lifestyle: active at residence, walking once per week, diet is ok              Current Outpatient Prescriptions:  .  acetaminophen (TYLENOL) 500 MG tablet, Take 500 mg by mouth daily as needed (foot pain). , Disp: , Rfl:  .  aspirin EC 81 MG tablet, Take 81 mg by mouth daily., Disp: , Rfl:  .  Cholecalciferol (VITAMIN D PO), Take 2,000 Units by mouth daily. , Disp: , Rfl:  .  Flaxseed, Linseed, (FLAX SEED  OIL PO), Take 1 tablet by mouth daily. , Disp: , Rfl:  .  Loratadine (ALAVERT PO), Take 1 tablet by mouth daily as needed (for allergies). , Disp: , Rfl:  .  meclizine (ANTIVERT) 25 MG tablet, Take 1 tablet (25 mg total) by mouth 3 (three) times daily as needed for dizziness., Disp: 30 tablet, Rfl: 0 .  vitamin B-12 (CYANOCOBALAMIN) 1000 MCG tablet, Take 1,000 mcg by mouth daily., Disp: , Rfl:   EXAM:  There were no vitals filed for this visit.  There is no height or weight on file to calculate BMI.  GENERAL: vitals reviewed and listed above, alert, oriented, appears well hydrated and in no acute distress  HEENT: atraumatic, conjunttiva clear, no obvious abnormalities on inspection of external nose and ears  NECK: no obvious masses on inspection  LUNGS: clear to auscultation bilaterally, no wheezes, rales or rhonchi, good air movement  CV: HRRR, no  peripheral edema  MS: moves all extremities without noticeable abnormality  PSYCH: pleasant and cooperative, no obvious depression or anxiety  ASSESSMENT AND PLAN:  Discussed the following assessment and plan:  No diagnosis found.  -Patient advised to return or notify a doctor immediately if symptoms worsen or persist or new concerns arise.  There are no Patient Instructions on file for this visit.  Colin Benton R., DO

## 2016-05-25 ENCOUNTER — Ambulatory Visit: Payer: Medicare Other | Admitting: Family Medicine

## 2016-05-25 ENCOUNTER — Ambulatory Visit: Payer: Medicare Other | Attending: Family Medicine | Admitting: Rehabilitative and Restorative Service Providers"

## 2016-05-25 DIAGNOSIS — R2681 Unsteadiness on feet: Secondary | ICD-10-CM | POA: Insufficient documentation

## 2016-05-25 DIAGNOSIS — R2689 Other abnormalities of gait and mobility: Secondary | ICD-10-CM | POA: Insufficient documentation

## 2016-05-25 DIAGNOSIS — R42 Dizziness and giddiness: Secondary | ICD-10-CM

## 2016-05-25 DIAGNOSIS — H8111 Benign paroxysmal vertigo, right ear: Secondary | ICD-10-CM | POA: Insufficient documentation

## 2016-05-25 NOTE — Patient Instructions (Addendum)
EVERY DAY EXERCISE:  Gaze Stabilization - Tip Card  1.Target must remain in focus, not blurry, and appear stationary while head is in motion. 2.Perform exercises with small head movements (45 to either side of midline). 3.Increase speed of head motion so long as target is in focus. 4.If you wear eyeglasses, be sure you can see target through lens (therapist will give specific instructions for bifocal / progressive lenses). 5.These exercises may provoke dizziness or nausea. Work through these symptoms. If too dizzy, slow head movement slightly. Rest between each exercise. 6.Exercises demand concentration; avoid distractions. 7.For safety, perform standing exercises close to a counter, wall, corner, or next to someone.  Copyright  VHI. All rights reserved.    Gaze Stabilization - Standing Feet Apart   Feet shoulder width apart, keeping eyes on target on wall 3 feet away, tilt head down slightly and move head side to side for 30 seconds. *Work up to tolerating 60 seconds, as able. Do 2-3 sessions per day.   Copyright  VHI. All rights reserved.   BALANCE (Monday, Wednesday, Friday):   Feet Together, Varied Arm Positions - Eyes Closed    Stand with feet together and arms out. Close eyes and visualize upright position. Hold __10__ seconds. Repeat __5__ times per session. Do _1-2___ sessions per day.  *WORK UP TO HOLDING 30 SECONDS. Copyright  VHI. All rights reserved.   Feet Heel-Toe "Tandem", Varied Arm Positions - Eyes Open    With eyes open, right foot directly in front of the other, arms out, look straight ahead at a stationary object. Hold _30___ seconds, then switch feet. Repeat __3__ times per session. Do __1-2__ sessions per day.  Copyright  VHI. All rights reserved.   SINGLE LIMB STANCE    Stance: single leg on floor. Raise leg. Hold _10__ seconds. Repeat with other leg. _3__ reps per set.   __1-2 times per day.  Copyright  VHI. All rights reserved.    LEG STRENGTHENING:  (Tuesday, Thursday, Saturday)  Straight Leg Raise    Tighten stomach and slowly raise locked right leg _8___ inches from floor. Repeat __10__ times per set. Do __2__ sets per session. Do __1-2__ sessions per day.  http://orth.exer.us/1103   Copyright  VHI. All rights reserved.   ABDUCTION: Side-Lying (Active)    Lie on left side, top leg straight. Raise top leg as far as possible. NO WEIGHT Complete __2_ sets of _10__ repetitions. Perform _1-2__ sessions per day.  http://gtsc.exer.us/95   Copyright  VHI. All rights reserved.   AS NEEDED FOR VERTIGO  Habituation - Tip Card  1.The goal of habituation training is to assist in decreasing symptoms of vertigo, dizziness, or nausea provoked by specific head and body motions. 2.These exercises may initially increase symptoms; however, be persistent and work through symptoms. With repetition and time, the exercises will assist in reducing or eliminating symptoms. 3.Exercises should be stopped and discussed with the therapist if you experience any of the following: - Sudden change or fluctuation in hearing - New onset of ringing in the ears, or increase in current intensity - Any fluid discharge from the ear - Severe pain in neck or back - Extreme nausea  Copyright  VHI. All rights reserved.   Habituation - Sit to Side-Lying   Sit on edge of bed. Lie down onto the right side and hold until dizziness stops, plus 20 seconds.  Return to sitting and wait until dizziness stops, plus 20 seconds.  Repeat to the left side. Repeat sequence 5  times per session. Do 2 sessions per day.  Copyright  VHI. All rights reserved.    ONLY DO THIS EXERCISE IF VERTIGO RETURNS. DO UNTIL YOU HAVE 2 DAYS WITHOUT VERTIGO WHEN YOU MOVE Marina Goodell, [facility] [address] [place], [place]  [address] Phone:  [phone] Fax:  [phone]

## 2016-05-25 NOTE — Therapy (Signed)
[facility] [address] [place], [place], [address] Phone: [phone]   Fax:  [phone]  Physical Therapy Treatment  Patient Details  Name: Diane Roach MRN: [id number] Date of Birth: 1930-09-07 Referring Provider: Colin Benton, DO  Encounter Date: 05/25/2016      PT End of Session - 05/25/16 0936    Visit Number 2   Number of Visits 6   Date for PT Re-Evaluation 06/12/16   Authorization Type G code every 10th visit   PT Start Time 0932   PT Stop Time 1015   PT Time Calculation (min) 43 min   Activity Tolerance Patient tolerated treatment well   Behavior During Therapy [facility] for tasks assessed/performed      Past Medical History:  Diagnosis Date  . Arthritis   . Blood dyscrasia    low platelets  . Cancer ([place])    skin cancer (removed)  . DVT (deep venous thrombosis) ([place])    left lower extremity once in 2015 after long car ride, refused chronic anticoagulation  . FH: pulmonary embolism 10/12/2013  . Frequent UTI    hx of   . GERD (gastroesophageal reflux disease)   . Heart murmur    was told years ago she had a murmur but no one mentions it now.  Marland Kitchen Heterozygous factor V Leiden mutation ([place])   . History of hiatal hernia   . Hoarseness    sees [facility]  . Intermittent self-catheterization of bladder    chronic urinary retention, sees urologist  . Peripheral vascular disease ([place])   . Vertigo    2016  . Vitamin B deficiency   . Vitamin D deficiency     Past Surgical History:  Procedure Laterality Date  . ABDOMINAL HYSTERECTOMY    . APPENDECTOMY    . BUNIONECTOMY Bilateral   . CATARACT EXTRACTION Left 1999   with lens implant  . CHOLECYSTECTOMY    . COLONOSCOPY W/ POLYPECTOMY    . HERNIA REPAIR    . JOINT REPLACEMENT Right 2007   hip  . MICROLARYNGOSCOPY W/VOCAL CORD INJECTION N/A 04/30/2014   Procedure: MICROLARYNGOSCOPY WITH VOCAL CORD INJECTION;  Surgeon: Melida Quitter, MD;   Location: [facility];  Service: ENT;  Laterality: N/A;  Micro Direct Laryngoscopy with Prolaryn injection/jet ventilation  . MICROLARYNGOSCOPY W/VOCAL CORD INJECTION Bilateral 05/24/2015   Procedure: Micro Direct Laryngoscopy with Vocal Fold Prolaryn Injection/Jet Venturi Ventilation;  Surgeon: Melida Quitter, MD;  Location: [facility];  Service: ENT;  Laterality: Bilateral;  . TONSILLECTOMY      There were no vitals filed for this visit.      Subjective Assessment - 05/25/16 0931    Subjective The patient reports that she is no longer having the vertigo, however her stride is still a little "wobbly".     Patient Stated Goals "I have no idea".   Currently in Pain? Yes  still senses a heaviness in the back of her head with vertigo resolved "it doesn't trouble me"   Effect of Pain on Daily Activities PT to monitor, no goals to follow due to nature of referral.                Vestibular Assessment - 05/25/16 0937      Vestibular Assessment   General Observation No sensation of vertigo per subjective report.     Positional Testing   Sidelying Test Sidelying Right;Sidelying Left     Sidelying Right   Sidelying Right Duration no dizziness reports "just  a sense it could begin"   Sidelying Right Symptoms No nystagmus     Sidelying Left   Sidelying Left Duration none   Sidelying Left Symptoms No nystagmus                 [facility] Adult PT Treatment/Exercise - 05/25/16 7124      Ambulation/Gait   Ambulation/Gait Yes   Ambulation/Gait Assistance 6: Modified independent (Device/Increase time)   Ambulation Distance (Feet) 300 Feet   Assistive device None   Ambulation Surface Level     Neuro Re-ed    Neuro Re-ed Details  Corner balance exercises feet together + eyes closed, tandem stance, single leg stance.      Exercises   Exercises Knee/Hip     Knee/Hip Exercises: Supine   Straight Leg Raises Strengthening;Right;Left;10 reps     Knee/Hip Exercises: Sidelying   Hip ABduction  10 reps;Right;Left;Strengthening         Vestibular Treatment/Exercise - 05/25/16 0958      Vestibular Treatment/Exercise   Vestibular Treatment Provided Habituation;Gaze   Habituation Exercises Nestor Lewandowsky   Gaze Exercises X1 Viewing Horizontal     Nestor Lewandowsky   Number of Reps  2   Symptom Description  provided for home exercise for self mgmt of BPPV     X1 Viewing Horizontal   Foot Position standing feet apart   Comments 30 seconds with cues on head/shoulder position               PT Education - 05/25/16 1014    Education provided Yes   Education Details HEP:  balance, habituation, and LE strengthening   Person(s) Educated Patient   Methods Explanation;Demonstration;Handout   Comprehension Verbalized understanding;Returned demonstration          PT Short Term Goals - 05/13/16 1228      PT SHORT TERM GOAL #1   Title STGs=LTGs           PT Long Term Goals - 05/25/16 1020      PT LONG TERM GOAL #1   Title The patient will return demo HEP for gaze x 1 adaptation, habituation, and balance activities.   Baseline Target date 06/12/2016   Time 4   Period Weeks     PT LONG TERM GOAL #2   Title The patient will improve Berg balance scale from 44/56 to > or equal to 49/56 to demo improving steady state balance.   Baseline Target date 06/12/2016   Time 4   Period Weeks     PT LONG TERM GOAL #3   Title The patient will improve DHI from 24% to < or equal to 10% to demo improved self perception of dizziness.   Baseline Target date 06/12/2016   Time 4   Period Weeks     PT LONG TERM GOAL #4   Title The patient will have negative positional testing for BPPV to demo resolution of symptoms.   Baseline Met on 05/24/2016   Time 4   Period Weeks   Status Achieved     PT LONG TERM GOAL #5   Title The patient will improve gait speed from 2.22 ft/sec to > or equal to 2.6 ft/sec to demo transition to "full community ambulator" classification of gait.   Baseline  Target date 06/12/2016   Time 4   Period Weeks               Plan - 05/25/16 1021    Clinical Impression Statement The patient's  vertigo is resolved today.  PT focused on adding HEP for LE strengthening, balance, and gait activities.    PT Treatment/Interventions ADLs/Self Care Home Management;Canalith Repostioning;Therapeutic activities;Therapeutic exercise;Balance training;Neuromuscular re-education;Gait training;Functional mobility training;Patient/family education;Vestibular   PT Next Visit Plan Check HEP, consider d/c, do [place] and G code.   Consulted and Agree with Plan of Care Patient      Patient will benefit from skilled therapeutic intervention in order to improve the following deficits and impairments:  Abnormal gait, Difficulty walking, Dizziness, Decreased balance  Visit Diagnosis: Other abnormalities of gait and mobility  Dizziness and giddiness  BPPV (benign paroxysmal positional vertigo), right  Unsteadiness on feet     Problem List Patient Active Problem List   Diagnosis Date Noted  . Heavy metal exposure 03/27/2016  . Pain of right hip joint 01/27/2016  . Hyperlipemia 02/11/2015  . Hoarseness 02/11/2015  . Arthritis, senescent 10/12/2013  . Urinary retention 10/12/2013  . Heterozygous factor V Leiden mutation ([place]) 10/12/2013  . FH: pulmonary embolism 10/12/2013    Aranda Bihm, PT 05/25/2016, 10:22 AM  [facility] [address] [place], [place], [address] Phone: [phone]   Fax:  [phone]  Name: Diane Roach MRN: [id number] Date of Birth: 1930-07-20

## 2016-05-26 ENCOUNTER — Encounter: Payer: Self-pay | Admitting: Family Medicine

## 2016-05-26 ENCOUNTER — Ambulatory Visit (INDEPENDENT_AMBULATORY_CARE_PROVIDER_SITE_OTHER): Payer: Medicare Other | Admitting: Family Medicine

## 2016-05-26 VITALS — BP 100/78 | HR 79 | Temp 97.8°F | Ht 63.75 in | Wt 157.3 lb

## 2016-05-26 DIAGNOSIS — R42 Dizziness and giddiness: Secondary | ICD-10-CM | POA: Diagnosis not present

## 2016-05-26 NOTE — Progress Notes (Signed)
HPI:  Here for follow up regarding vertigo. Has had PT/Vestibular rehab x2 with "miracle" cure of symptoms. Reports no symptoms since fist session. Also working on balance exercises. Sees ortho for hip issues and has follow up next month. No other concerns today. Neck pain has also resolved and feels this was related to tension from trying not to turn head with the BPPV.  ROS: See pertinent positives and negatives per HPI.  Past Medical History:  Diagnosis Date  . Arthritis   . Blood dyscrasia    low platelets  . Cancer ([place])    skin cancer (removed)  . DVT (deep venous thrombosis) ([place])    left lower extremity once in 2015 after long car ride, refused chronic anticoagulation  . FH: pulmonary embolism 10/12/2013  . Frequent UTI    hx of   . GERD (gastroesophageal reflux disease)   . Heart murmur    was told years ago she had a murmur but no one mentions it now.  Marland Kitchen Heterozygous factor V Leiden mutation ([place])   . History of hiatal hernia   . Hoarseness    sees [facility]  . Intermittent self-catheterization of bladder    chronic urinary retention, sees urologist  . Peripheral vascular disease ([place])   . Vertigo    2016  . Vitamin B deficiency   . Vitamin D deficiency     Past Surgical History:  Procedure Laterality Date  . ABDOMINAL HYSTERECTOMY    . APPENDECTOMY    . BUNIONECTOMY Bilateral   . CATARACT EXTRACTION Left 1999   with lens implant  . CHOLECYSTECTOMY    . COLONOSCOPY W/ POLYPECTOMY    . HERNIA REPAIR    . JOINT REPLACEMENT Right 2007   hip  . MICROLARYNGOSCOPY W/VOCAL CORD INJECTION N/A 04/30/2014   Procedure: MICROLARYNGOSCOPY WITH VOCAL CORD INJECTION;  Surgeon: Melida Quitter, MD;  Location: [facility];  Service: ENT;  Laterality: N/A;  Micro Direct Laryngoscopy with Prolaryn injection/jet ventilation  . MICROLARYNGOSCOPY W/VOCAL CORD INJECTION Bilateral 05/24/2015   Procedure: Micro Direct Laryngoscopy with Vocal Fold Prolaryn Injection/Jet Venturi  Ventilation;  Surgeon: Melida Quitter, MD;  Location: [facility];  Service: ENT;  Laterality: Bilateral;  . TONSILLECTOMY      Family History  Problem Relation Age of Onset  . Heart disease Mother     chf  . Cancer Father     died at age 39    Social History   Social History  . Marital status: Widowed    Spouse name: N/A  . Number of children: N/A  . Years of education: N/A   Social History Main Topics  . Smoking status: Never Smoker  . Smokeless tobacco: Never Used  . Alcohol use 0.0 oz/week     Comment: glass on a rare occ  . Drug use: No  . Sexual activity: Not Asked   Other Topics Concern  . None   Social History Narrative   Work or School: volunteers with music group      Home Situation: lives in [place], religious      Lifestyle: active at residence, walking once per week, diet is ok              Current Outpatient Prescriptions:  .  acetaminophen (TYLENOL) 500 MG tablet, Take 500 mg by mouth daily as needed (foot pain). , Disp: , Rfl:  .  aspirin EC 81 MG tablet, Take 81 mg by  mouth daily., Disp: , Rfl:  .  Cholecalciferol (VITAMIN D PO), Take 2,000 Units by mouth daily. , Disp: , Rfl:  .  Flaxseed, Linseed, (FLAX SEED OIL PO), Take 1 tablet by mouth daily. , Disp: , Rfl:  .  Loratadine (ALAVERT PO), Take 1 tablet by mouth daily as needed (for allergies). , Disp: , Rfl:  .  meclizine (ANTIVERT) 25 MG tablet, Take 1 tablet (25 mg total) by mouth 3 (three) times daily as needed for dizziness., Disp: 30 tablet, Rfl: 0 .  vitamin B-12 (CYANOCOBALAMIN) 1000 MCG tablet, Take 1,000 mcg by mouth daily., Disp: , Rfl:   EXAM:  Vitals:   05/26/16 1026  BP: 100/78  Pulse: 79  Temp: 97.8 F (36.6 C)    Body mass index is 27.21 kg/m.  GENERAL: vitals reviewed and listed above, alert, oriented, appears well hydrated and in no acute distress  HEENT: atraumatic, conjunttiva clear, no obvious abnormalities on inspection  of external nose and ears  NECK: no obvious masses on inspection  LUNGS: clear to auscultation bilaterally, no wheezes, rales or rhonchi, good air movement  CV: HRRR, no peripheral edema  MS: moves all extremities without noticeable abnormality  PSYCH: pleasant and cooperative, no obvious depression or anxiety  ASSESSMENT AND PLAN:  Discussed the following assessment and plan:  Vertigo - BPPV - resolved with vestibular rehab 04/2016  -so glad symptoms resolved with treatment, has follow up plan with therapist -lifestyle recs for general health -AWV this fall -Patient advised to return or notify a doctor immediately if symptoms worsen or persist or new concerns arise.  Patient Instructions  BEFORE YOU LEAVE: -follow up: Medicare exam with Dr. Maudie Mercury in September  I am glad you are doing so much better!   We recommend the following healthy lifestyle for LIFE: 1) Small portions.   Tip: eat off of a salad plate instead of a dinner plate.  Tip: if you need more or a snack choose fruits, veggies and/or a handful of nuts or seeds.  2) Eat a healthy clean diet.  * Tip: Avoid (less then 1 serving per week): processed foods, sweets, sweetened drinks, white starches (rice, flour, bread, potatoes, pasta, etc), red meat, fast foods, butter  *Tip: CHOOSE instead   * 5-9 servings per day of fresh or frozen fruits and vegetables (but not corn, potatoes, bananas, canned or dried fruit)   *nuts and seeds, beans   *olives and olive oil   *small portions of lean meats such as fish and white chicken    *small portions of whole grains  3)Get at least 150 minutes of sweaty aerobic exercise per week.  4)Reduce stress - consider counseling, meditation and relaxation to balance other aspects of your life.   WE NOW OFFER   [facility] FAST TRACK!!!  SAME DAY Appointments for ACUTE CARE  Such as: Sprains, Injuries, cuts, abrasions, rashes, muscle pain, joint pain, back pain Colds,  flu, sore throats, headache, allergies, cough, fever  Ear pain, sinus and eye infections Abdominal pain, nausea, vomiting, diarrhea, upset stomach Animal/insect bites  3 Easy Ways to Schedule: Walk-In Scheduling Call in scheduling Mychart Sign-up: [email or web address]            Colin Benton R., DO

## 2016-05-26 NOTE — Progress Notes (Signed)
Pre visit review using our clinic review tool, if applicable. No additional management support is needed unless otherwise documented below in the visit note. 

## 2016-05-26 NOTE — Patient Instructions (Signed)
BEFORE YOU LEAVE: -follow up: Medicare exam with Dr. Maudie Mercury in September  I am glad you are doing so much better!   We recommend the following healthy lifestyle for LIFE: 1) Small portions.   Tip: eat off of a salad plate instead of a dinner plate.  Tip: if you need more or a snack choose fruits, veggies and/or a handful of nuts or seeds.  2) Eat a healthy clean diet.  * Tip: Avoid (less then 1 serving per week): processed foods, sweets, sweetened drinks, white starches (rice, flour, bread, potatoes, pasta, etc), red meat, fast foods, butter  *Tip: CHOOSE instead   * 5-9 servings per day of fresh or frozen fruits and vegetables (but not corn, potatoes, bananas, canned or dried fruit)   *nuts and seeds, beans   *olives and olive oil   *small portions of lean meats such as fish and white chicken    *small portions of whole grains  3)Get at least 150 minutes of sweaty aerobic exercise per week.  4)Reduce stress - consider counseling, meditation and relaxation to balance other aspects of your life.   WE NOW OFFER   [facility] FAST TRACK!!!  SAME DAY Appointments for ACUTE CARE  Such as: Sprains, Injuries, cuts, abrasions, rashes, muscle pain, joint pain, back pain Colds, flu, sore throats, headache, allergies, cough, fever  Ear pain, sinus and eye infections Abdominal pain, nausea, vomiting, diarrhea, upset stomach Animal/insect bites  3 Easy Ways to Schedule: Walk-In Scheduling Call in scheduling Mychart Sign-up: [email or web address]

## 2016-05-29 ENCOUNTER — Encounter: Payer: Medicare Other | Admitting: Rehabilitative and Restorative Service Providers"

## 2016-06-05 ENCOUNTER — Ambulatory Visit: Payer: Medicare Other | Admitting: Rehabilitative and Restorative Service Providers"

## 2016-06-05 DIAGNOSIS — R2681 Unsteadiness on feet: Secondary | ICD-10-CM | POA: Diagnosis not present

## 2016-06-05 DIAGNOSIS — R2689 Other abnormalities of gait and mobility: Secondary | ICD-10-CM

## 2016-06-05 DIAGNOSIS — H8111 Benign paroxysmal vertigo, right ear: Secondary | ICD-10-CM | POA: Diagnosis not present

## 2016-06-05 DIAGNOSIS — R42 Dizziness and giddiness: Secondary | ICD-10-CM

## 2016-06-05 NOTE — Therapy (Signed)
[facility] [address] [place], [place], [address] Phone: [phone]   Fax:  [phone]  Physical Therapy Treatment  Patient Details  Name: Diane Roach MRN: [id number] Date of Birth: 1930/10/07 Referring Provider: Colin Benton, DO  Encounter Date: 06/05/2016      PT End of Session - 06/05/16 1240    Visit Number 3   Number of Visits 6   Date for PT Re-Evaluation 06/12/16   Authorization Type G code every 10th visit   PT Start Time 1238   PT Stop Time 1305   PT Time Calculation (min) 27 min   Activity Tolerance Patient tolerated treatment well   Behavior During Therapy [facility] for tasks assessed/performed      Past Medical History:  Diagnosis Date  . Arthritis   . Blood dyscrasia    low platelets  . Cancer ([place])    skin cancer (removed)  . DVT (deep venous thrombosis) ([place])    left lower extremity once in 2015 after long car ride, refused chronic anticoagulation  . FH: pulmonary embolism 10/12/2013  . Frequent UTI    hx of   . GERD (gastroesophageal reflux disease)   . Heart murmur    was told years ago she had a murmur but no one mentions it now.  Marland Kitchen Heterozygous factor V Leiden mutation ([place])   . History of hiatal hernia   . Hoarseness    sees [facility]  . Intermittent self-catheterization of bladder    chronic urinary retention, sees urologist  . Peripheral vascular disease ([place])   . Vertigo    2016  . Vitamin B deficiency   . Vitamin D deficiency     Past Surgical History:  Procedure Laterality Date  . ABDOMINAL HYSTERECTOMY    . APPENDECTOMY    . BUNIONECTOMY Bilateral   . CATARACT EXTRACTION Left 1999   with lens implant  . CHOLECYSTECTOMY    . COLONOSCOPY W/ POLYPECTOMY    . HERNIA REPAIR    . JOINT REPLACEMENT Right 2007   hip  . MICROLARYNGOSCOPY W/VOCAL CORD INJECTION N/A 04/30/2014   Procedure: MICROLARYNGOSCOPY WITH VOCAL CORD INJECTION;  Surgeon: Melida Quitter, MD;   Location: [facility];  Service: ENT;  Laterality: N/A;  Micro Direct Laryngoscopy with Prolaryn injection/jet ventilation  . MICROLARYNGOSCOPY W/VOCAL CORD INJECTION Bilateral 05/24/2015   Procedure: Micro Direct Laryngoscopy with Vocal Fold Prolaryn Injection/Jet Venturi Ventilation;  Surgeon: Melida Quitter, MD;  Location: [facility];  Service: ENT;  Laterality: Bilateral;  . TONSILLECTOMY      There were no vitals filed for this visit.      Subjective Assessment - 06/05/16 1230    Subjective The patient reports that she is doing exercises and feels they are very helpful.     Patient Stated Goals "I have no idea".            [facility] PT Assessment - 06/05/16 1257      Standardized Balance Assessment   Standardized Balance Assessment Berg Balance Test     Berg Balance Test   Sit to Stand Able to stand without using hands and stabilize independently   Standing Unsupported Able to stand safely 2 minutes   Sitting with Back Unsupported but Feet Supported on Floor or Stool Able to sit safely and securely 2 minutes   Stand to Sit Sits safely with minimal use of hands   Transfers Able to transfer safely, minor use of hands   Standing Unsupported with Eyes Closed  Able to stand 10 seconds safely   Standing Ubsupported with Feet Together Able to place feet together independently and stand 1 minute safely   From Standing, Reach Forward with Outstretched Arm Can reach confidently >25 cm (10")   From Standing Position, Pick up Object from Floor Able to pick up shoe safely and easily   From Standing Position, Turn to Look Behind Over each Shoulder Looks behind from both sides and weight shifts well   Turn 360 Degrees Able to turn 360 degrees safely in 4 seconds or less   Standing Unsupported, Alternately Place Feet on Step/Stool Able to stand independently and safely and complete 8 steps in 20 seconds   Standing Unsupported, One Foot in Front Able to plae foot ahead of the other independently and hold 30  seconds   Standing on One Leg Able to lift leg independently and hold equal to or more than 3 seconds   Total Score 53   Berg comment: 53/56                     [facility] Adult PT Treatment/Exercise - 06/05/16 1307      Self-Care   Self-Care Other Self-Care Comments   Other Self-Care Comments  Discussed increasing walking distance for exercise and continuation of HEP.      Neuro Re-ed    Neuro Re-ed Details  Reviewed balance HEP in corner consisting of single leg stance, eyes closed + narrow base of support, and tandem standing.      Exercises   Exercises Other Exercises   Other Exercises  The patient verbally discussed strengthening exercises -PT recommends perform bilaterally.                    PT Short Term Goals - 05/13/16 1228      PT SHORT TERM GOAL #1   Title STGs=LTGs           PT Long Term Goals - 06/05/16 1257      PT LONG TERM GOAL #1   Title The patient will return demo HEP for gaze x 1 adaptation, habituation, and balance activities.   Baseline Met on 06/04/16   Time 4   Period Weeks   Status Achieved     PT LONG TERM GOAL #2   Title The patient will improve Berg balance scale from 44/56 to > or equal to 49/56 to demo improving steady state balance.   Baseline scores 53/56   Time 4   Period Weeks   Status Achieved     PT LONG TERM GOAL #3   Title The patient will improve DHI from 24% to < or equal to 10% to demo improved self perception of dizziness.   Baseline 0% on [place] indicating no disability related to dizziness.   Time 4   Period Weeks   Status Achieved     PT LONG TERM GOAL #4   Title The patient will have negative positional testing for BPPV to demo resolution of symptoms.   Baseline Met on 05/24/2016   Time 4   Period Weeks   Status Achieved     PT LONG TERM GOAL #5   Title The patient will improve gait speed from 2.22 ft/sec to > or equal to 2.6 ft/sec to demo transition to "full community ambulator" classification  of gait.   Baseline 3.01 ft/sec on 06/04/16   Time 4   Period Weeks   Status Achieved  Plan - 2016/06/17 1306    Clinical Impression Statement The patient met all LTGs for physical therapy.  She has comprehensive HEP to address LE strengthening and balance.  She notes full resolution of BPPV since treatment at initial evaluation.    PT Treatment/Interventions ADLs/Self Care Home Management;Canalith Repostioning;Therapeutic activities;Therapeutic exercise;Balance training;Neuromuscular re-education;Gait training;Functional mobility training;Patient/family education;Vestibular   PT Next Visit Plan Discharge today      Patient will benefit from skilled therapeutic intervention in order to improve the following deficits and impairments:  Abnormal gait, Difficulty walking, Dizziness, Decreased balance  Visit Diagnosis: Other abnormalities of gait and mobility  Dizziness and giddiness  Unsteadiness on feet       G-Codes - 17-Jun-2016 1307    Functional Assessment Tool Used (Outpatient Only) Berg=53/56, DHI=0%   Functional Limitation Mobility: Walking and moving around   Mobility: Walking and Moving Around Goal Status [id number]) At least 1 percent but less than 20 percent impaired, limited or restricted   Mobility: Walking and Moving Around Discharge Status [phone]) At least 1 percent but less than 20 percent impaired, limited or restricted     PHYSICAL THERAPY DISCHARGE SUMMARY  Visits from Start of Care: 3  Current functional level related to goals / functional outcomes: See above   Remaining deficits: No dizziness, some mild weakness in hip and dec'd high level balance--addressed via HEP   Education / Equipment: HEP, progression of home walking.  Plan: Patient agrees to discharge.  Patient goals were met. Patient is being discharged due to meeting the stated rehab goals.  ?????        Thank you for the referral of this patient. Rudell Cobb,  MPT  [place] , PT Jun 17, 2016, 3:56 PM  [facility] [address] [place], [place], [address] Phone: [phone]   Fax:  [phone]  Name: Diane Roach MRN: [id number] Date of Birth: [birth year]

## 2016-06-12 ENCOUNTER — Encounter: Payer: Medicare Other | Admitting: Rehabilitative and Restorative Service Providers"

## 2016-08-06 ENCOUNTER — Encounter: Payer: Self-pay | Admitting: Family Medicine

## 2016-08-06 ENCOUNTER — Ambulatory Visit (INDEPENDENT_AMBULATORY_CARE_PROVIDER_SITE_OTHER): Payer: Medicare Other | Admitting: Family Medicine

## 2016-08-06 VITALS — BP 132/80 | HR 79 | Temp 97.8°F | Ht 63.75 in | Wt 160.6 lb

## 2016-08-06 DIAGNOSIS — L309 Dermatitis, unspecified: Secondary | ICD-10-CM

## 2016-08-06 MED ORDER — TRIAMCINOLONE ACETONIDE 0.1 % EX CREA: 1.0000 "application " | TOPICAL_CREAM | Freq: Two times a day (BID) | CUTANEOUS | 0 refills | Status: DC

## 2016-08-06 NOTE — Progress Notes (Signed)
HPI:  Acute visit for:  Skin rash: -x6 days; started while visiting daugher and using new detergent -ant neck/collar line -itchy -benadryl cream helping some -no sob, wheezing, rash elsewhere, known bites, fevers, malaise, pain   ROS: See pertinent positives and negatives per HPI.  Past Medical History:  Diagnosis Date  . Arthritis   . Blood dyscrasia    low platelets  . Cancer ([place])    skin cancer (removed)  . DVT (deep venous thrombosis) ([place])    left lower extremity once in 2015 after long car ride, refused chronic anticoagulation  . FH: pulmonary embolism 10/12/2013  . Frequent UTI    hx of   . GERD (gastroesophageal reflux disease)   . Heart murmur    was told years ago she had a murmur but no one mentions it now.  Marland Kitchen Heterozygous factor V Leiden mutation ([place])   . History of hiatal hernia   . Hoarseness    sees [facility]  . Intermittent self-catheterization of bladder    chronic urinary retention, sees urologist  . Peripheral vascular disease ([place])   . Vertigo    2016  . Vitamin B deficiency   . Vitamin D deficiency     Past Surgical History:  Procedure Laterality Date  . ABDOMINAL HYSTERECTOMY    . APPENDECTOMY    . BUNIONECTOMY Bilateral   . CATARACT EXTRACTION Left 1999   with lens implant  . CHOLECYSTECTOMY    . COLONOSCOPY W/ POLYPECTOMY    . HERNIA REPAIR    . JOINT REPLACEMENT Right 2007   hip  . MICROLARYNGOSCOPY W/VOCAL CORD INJECTION N/A 04/30/2014   Procedure: MICROLARYNGOSCOPY WITH VOCAL CORD INJECTION;  Surgeon: Melida Quitter, MD;  Location: [facility];  Service: ENT;  Laterality: N/A;  Micro Direct Laryngoscopy with Prolaryn injection/jet ventilation  . MICROLARYNGOSCOPY W/VOCAL CORD INJECTION Bilateral 05/24/2015   Procedure: Micro Direct Laryngoscopy with Vocal Fold Prolaryn Injection/Jet Venturi Ventilation;  Surgeon: Melida Quitter, MD;  Location: [facility];  Service: ENT;  Laterality: Bilateral;  . TONSILLECTOMY      Family History   Problem Relation Age of Onset  . Heart disease Mother        chf  . Cancer Father        died at age 46    Social History   Social History  . Marital status: Widowed    Spouse name: N/A  . Number of children: N/A  . Years of education: N/A   Social History Main Topics  . Smoking status: Never Smoker  . Smokeless tobacco: Never Used  . Alcohol use 0.0 oz/week     Comment: glass on a rare occ  . Drug use: No  . Sexual activity: Not Asked   Other Topics Concern  . None   Social History Narrative   Work or School: volunteers with music group      Home Situation: lives in [place], religious      Lifestyle: active at residence, walking once per week, diet is ok              Current Outpatient Prescriptions:  .  acetaminophen (TYLENOL) 500 MG tablet, Take 500 mg by mouth daily as needed (foot pain). , Disp: , Rfl:  .  aspirin EC 81 MG tablet, Take 81 mg by mouth daily., Disp: , Rfl:  .  Cholecalciferol (VITAMIN D PO), Take 2,000 Units by mouth daily. , Disp: , Rfl:  .  Flaxseed, Linseed, (FLAX SEED OIL PO), Take 1 tablet by mouth daily. , Disp: , Rfl:  .  Loratadine (ALAVERT PO), Take 1 tablet by mouth daily as needed (for allergies). , Disp: , Rfl:  .  meclizine (ANTIVERT) 25 MG tablet, Take 1 tablet (25 mg total) by mouth 3 (three) times daily as needed for dizziness., Disp: 30 tablet, Rfl: 0 .  vitamin B-12 (CYANOCOBALAMIN) 1000 MCG tablet, Take 1,000 mcg by mouth daily., Disp: , Rfl:  .  triamcinolone cream (KENALOG) 0.1 %, Apply 1 application topically 2 (two) times daily., Disp: 30 g, Rfl: 0  EXAM:  Vitals:   08/06/16 1019  BP: 132/80  Pulse: 79  Temp: 97.8 F (36.6 C)    Body mass index is 27.78 kg/m.  GENERAL: vitals reviewed and listed above, alert, oriented, appears well hydrated and in no acute distress  HEENT: atraumatic, conjunttiva clear, no obvious abnormalities on inspection of external nose and  ears  NECK: no obvious masses on inspection  LUNGS: clear to auscultation bilaterally, no wheezes, rales or rhonchi, good air movement  CV: HRRR, no peripheral edema  SKIN: area of erythema and erythematous macules and papules on ant neck with excoriations  MS: moves all extremities without noticeable abnormality  PSYCH: pleasant and cooperative, no obvious depression or anxiety  ASSESSMENT AND PLAN:  Discussed the following assessment and plan:  Dermatitis  -we discussed possible serious and likely etiologies, workup and treatment, treatment risks and return precautions - suspect contact dermatitis -after this discussion, Naeemah opted for hypoallergenic regimen, top steroid, antihistamine - had ? Reaction to prednisone, but not to topical steroids -follow up advised in 2 weeks -of course, we advised Alanah  to return or notify a doctor immediately if symptoms worsen or persist or new concerns arise.  Patient Instructions  BEFORE YOU LEAVE: -follow up: 2 weeks  Zyrtec nightly for 2-4 weeks  Hypoallergenic soaps, detergents, lotions, etc.  Topical steroid 1-2 times daily.  I hope you are feeling better soon! Seek care sooner if worsening, new concerns or you are not improving with treatment.      Colin Benton R., DO

## 2016-08-06 NOTE — Patient Instructions (Signed)
BEFORE YOU LEAVE: -follow up: 2 weeks  Zyrtec nightly for 2-4 weeks  Hypoallergenic soaps, detergents, lotions, etc.  Topical steroid 1-2 times daily.  I hope you are feeling better soon! Seek care sooner if worsening, new concerns or you are not improving with treatment.

## 2016-08-10 ENCOUNTER — Telehealth: Payer: Self-pay | Admitting: Family Medicine

## 2016-08-10 DIAGNOSIS — Z96641 Presence of right artificial hip joint: Secondary | ICD-10-CM | POA: Diagnosis not present

## 2016-08-10 NOTE — Telephone Encounter (Signed)
I left a message for the pt to return my call. 

## 2016-08-10 NOTE — Telephone Encounter (Signed)
I called the pt and informed her of the message below and she stated she is not worse, she just wanted to let Dr Maudie Mercury know.

## 2016-08-10 NOTE — Telephone Encounter (Signed)
Diane Roach pt returned your call °

## 2016-08-10 NOTE — Telephone Encounter (Signed)
Would treat as planned. See her specialist about the cobalt/chronmium. Follow up as planned or schedule sooner appt if worsening.

## 2016-08-10 NOTE — Telephone Encounter (Signed)
Pt was seen on 7-19 and now has break out or her arm. Pt is also going to [facility] for cobalt and chromium bloodwork. Pt is still taking med

## 2016-08-20 ENCOUNTER — Other Ambulatory Visit: Payer: Self-pay | Admitting: Cardiology

## 2016-08-20 ENCOUNTER — Ambulatory Visit (INDEPENDENT_AMBULATORY_CARE_PROVIDER_SITE_OTHER): Payer: Medicare Other | Admitting: Family Medicine

## 2016-08-20 ENCOUNTER — Encounter: Payer: Self-pay | Admitting: Family Medicine

## 2016-08-20 VITALS — BP 102/80 | HR 90 | Temp 97.5°F | Ht 63.75 in | Wt 159.1 lb

## 2016-08-20 DIAGNOSIS — R21 Rash and other nonspecific skin eruption: Secondary | ICD-10-CM | POA: Diagnosis not present

## 2016-08-20 DIAGNOSIS — H539 Unspecified visual disturbance: Secondary | ICD-10-CM | POA: Diagnosis not present

## 2016-08-20 DIAGNOSIS — D6851 Activated protein C resistance: Secondary | ICD-10-CM | POA: Diagnosis not present

## 2016-08-20 DIAGNOSIS — Z96649 Presence of unspecified artificial hip joint: Secondary | ICD-10-CM | POA: Diagnosis not present

## 2016-08-20 NOTE — Progress Notes (Signed)
HPI:  Follow-up skin rash: -To be contact dermatitis at her visit a few weeks ago, we did a trial of treatment with Zyrtec, hypoallergenic regimen and topical steroids -Reports: rash resolved and doing well  Mentions off hand at end of visit transient visual distrubance this morning: -reports she was not worried as had remote unilateral loss of vision and was told by her opthomologist at the time that it was a type of migraine and of no concern -reports brief (several minute) episode this morning when she could not see the "O" in the word "dog" that she was righting and had "wiggling" visual disturbance in the L eye and when she did visual testing on herself felt she had a partial vision loss in the L eye -this resolved quickly and she has not had any other symptoms -she is on an aspirin -she has an opthomologist and plans to contact him -denies headache, weakness, numbness, dizziness, chest pain, palpitations, any other symptoms or any recurrence  ROS: See pertinent positives and negatives per HPI.  Past Medical History:  Diagnosis Date  . Arthritis   . Blood dyscrasia    low platelets  . Cancer ([place])    skin cancer (removed)  . DVT (deep venous thrombosis) ([place])    left lower extremity once in 2015 after long car ride, refused chronic anticoagulation  . FH: pulmonary embolism 10/12/2013  . Frequent UTI    hx of   . GERD (gastroesophageal reflux disease)   . Heart murmur    was told years ago she had a murmur but no one mentions it now.  Marland Kitchen Heterozygous factor V Leiden mutation ([place])   . History of hiatal hernia   . Hoarseness    sees [facility]  . Intermittent self-catheterization of bladder    chronic urinary retention, sees urologist  . Peripheral vascular disease ([place])   . Vertigo    2016  . Vitamin B deficiency   . Vitamin D deficiency     Past Surgical History:  Procedure Laterality Date  . ABDOMINAL HYSTERECTOMY    . APPENDECTOMY    . BUNIONECTOMY  Bilateral   . CATARACT EXTRACTION Left 1999   with lens implant  . CHOLECYSTECTOMY    . COLONOSCOPY W/ POLYPECTOMY    . HERNIA REPAIR    . JOINT REPLACEMENT Right 2007   hip  . MICROLARYNGOSCOPY W/VOCAL CORD INJECTION N/A 04/30/2014   Procedure: MICROLARYNGOSCOPY WITH VOCAL CORD INJECTION;  Surgeon: Melida Quitter, MD;  Location: [facility];  Service: ENT;  Laterality: N/A;  Micro Direct Laryngoscopy with Prolaryn injection/jet ventilation  . MICROLARYNGOSCOPY W/VOCAL CORD INJECTION Bilateral 05/24/2015   Procedure: Micro Direct Laryngoscopy with Vocal Fold Prolaryn Injection/Jet Venturi Ventilation;  Surgeon: Melida Quitter, MD;  Location: [facility];  Service: ENT;  Laterality: Bilateral;  . TONSILLECTOMY      Family History  Problem Relation Age of Onset  . Heart disease Mother        chf  . Cancer Father        died at age 39    Social History   Social History  . Marital status: Widowed    Spouse name: N/A  . Number of children: N/A  . Years of education: N/A   Social History Main Topics  . Smoking status: Never Smoker  . Smokeless tobacco: Never Used  . Alcohol use 0.0 oz/week     Comment: glass on a rare occ  . Drug use: No  . Sexual activity: Not  Asked   Other Topics Concern  . None   Social History Narrative   Work or School: volunteers with music group      Home Situation: lives in [place], religious      Lifestyle: active at residence, walking once per week, diet is ok              Current Outpatient Prescriptions:  .  acetaminophen (TYLENOL) 500 MG tablet, Take 500 mg by mouth daily as needed (foot pain). , Disp: , Rfl:  .  aspirin EC 81 MG tablet, Take 81 mg by mouth daily., Disp: , Rfl:  .  Cholecalciferol (VITAMIN D PO), Take 2,000 Units by mouth daily. , Disp: , Rfl:  .  Flaxseed, Linseed, (FLAX SEED OIL PO), Take 1 tablet by mouth daily. , Disp: , Rfl:  .  Loratadine (ALAVERT PO), Take 1 tablet by mouth daily as  needed (for allergies). , Disp: , Rfl:  .  meclizine (ANTIVERT) 25 MG tablet, Take 1 tablet (25 mg total) by mouth 3 (three) times daily as needed for dizziness., Disp: 30 tablet, Rfl: 0 .  triamcinolone cream (KENALOG) 0.1 %, Apply 1 application topically 2 (two) times daily., Disp: 30 g, Rfl: 0 .  vitamin B-12 (CYANOCOBALAMIN) 1000 MCG tablet, Take 1,000 mcg by mouth daily., Disp: , Rfl:   EXAM:  Vitals:   08/20/16 1553  BP: 102/80  Pulse: 90  Temp: (!) 97.5 F (36.4 C)    Body mass index is 27.52 kg/m.  GENERAL: vitals reviewed and listed above, alert, oriented, appears well hydrated and in no acute distress  HEENT: atraumatic, conjunttiva clear, PERRLA, visual acuity grossly intact, EOMI, visual field testing normal, no obvious abnormalities on inspection of external nose and ears  NECK: no obvious masses on inspection, supple, no bruit  LUNGS: clear to auscultation bilaterally, no wheezes, rales or rhonchi, good air movement  CV: HRRR, no peripheral edema  MS: moves all extremities without noticeable abnormality  PSYCH/NEURO: pleasant and cooperative, no obvious depression or anxiety, CN II-XII grossly intact, finger to nose normal, gait normal, speech and thought processing grossly intact  ASSESSMENT AND PLAN:  Discussed the following assessment and plan:  Transient visual disturbance, left  Skin rash  Heterozygous factor V Leiden mutation ([facility])  History of hip replacement, unspecified laterality  -skin rash is resolved -we discussed possible serious and likely etiologies regarding the visual disturbance, workup and treatment, treatment risks and return precautions, symptoms resolve and no neuro deficits on exam -after this discussion, Eriko opted for: I advised stat neuroimaging (preferably MRI/MRA if possible) and optho eval, she felt this was not of concern as she had similar symptoms in the past, but agreed to allow [place] to order oupt imaging and also agreed to  contact her opthomologist about symptoms today  -advised assistant to place orders and obtain neuroimaging appt - it is unclear whether or not she can have MRI as she is unsure of the type of hip prosthesis she has - assistant is clarifying with pt/ortho and imaging department  -also advised ER /emergency care if any recurrent symptoms or new symptoms in the interim  Patient Instructions  BEFORE YOU LEAVE: -Urgent/stat MRI/MRA - but check with radiology if ok with her hx hip replacement and orders - dx is transient visual disturbance  Call your opthomologist for evaluation of the visual disturbance and seek emergency care if any recurrent symptoms.  Colin Benton R., DO

## 2016-08-20 NOTE — Patient Instructions (Signed)
BEFORE YOU LEAVE: -Urgent/stat MRI/MRA - but check with radiology if ok with her hx hip replacement and orders - dx is transient visual disturbance  Call your opthomologist for evaluation of the visual disturbance and seek emergency care if any recurrent symptoms.

## 2016-08-21 ENCOUNTER — Telehealth: Payer: Self-pay | Admitting: *Deleted

## 2016-08-21 ENCOUNTER — Telehealth: Payer: Self-pay | Admitting: Family Medicine

## 2016-08-21 ENCOUNTER — Other Ambulatory Visit: Payer: Self-pay | Admitting: Family Medicine

## 2016-08-21 DIAGNOSIS — H2511 Age-related nuclear cataract, right eye: Secondary | ICD-10-CM | POA: Diagnosis not present

## 2016-08-21 DIAGNOSIS — H539 Unspecified visual disturbance: Secondary | ICD-10-CM

## 2016-08-21 DIAGNOSIS — H5310 Unspecified subjective visual disturbances: Secondary | ICD-10-CM | POA: Diagnosis not present

## 2016-08-21 NOTE — Progress Notes (Signed)
error 

## 2016-08-21 NOTE — Telephone Encounter (Signed)
I spoke with [facility] and they took a verbal order because was unable to order over the phone with the order number they provided and with the new ordering protocol in epic. Is anything further needed?

## 2016-08-21 NOTE — Telephone Encounter (Signed)
Thanks I had advise w/wo - I though last night you said we had to order each separately. Ok to order and please let me know the numbers in EPIC for MRI w/wo once we figure this out so can save to preferences.Thank you!

## 2016-08-21 NOTE — Telephone Encounter (Signed)
[facility] is calling stating that the order for the pt to have a MRA done should be put in for MRA w/out contrast.  They would like to see if this can be corrected so that they can get the pt scheduled .

## 2016-08-21 NOTE — Telephone Encounter (Signed)
Per Dr Maudie Mercury I called [facility] and spoke with Manus Gunning and advised her the pt has had a total hip replacement and has called her surgeon to see what type of metal she has in her hip prior to the MRI being done.  Manus Gunning stated the MRI cannot be done only with contrast, it must be with and without OR without only.  Message sent to Dr Maudie Mercury for re-ordering info.

## 2016-08-21 NOTE — Telephone Encounter (Signed)
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 Pt saw eye doctor and her retina is ok

## 2016-08-21 NOTE — Telephone Encounter (Signed)
Order re-entered for MRI without contrast.

## 2016-08-21 NOTE — Telephone Encounter (Signed)
I called [facility] and informed Mardene Celeste of the information below.

## 2016-08-24 ENCOUNTER — Telehealth: Payer: Self-pay | Admitting: Family Medicine

## 2016-08-24 NOTE — Telephone Encounter (Signed)
[facility] was informed of this on 8/3.

## 2016-08-24 NOTE — Telephone Encounter (Signed)
Patient spoke to Dr. Santiago Glad who placed pin in hip 10 years ago stated that she was able to receive an MRI.   Diane Roach is made of chromium and cobalt and the  prosthesis is made if titanium

## 2016-08-26 ENCOUNTER — Telehealth: Payer: Self-pay | Admitting: Family Medicine

## 2016-08-26 NOTE — Telephone Encounter (Signed)
Pt would like you to know her MRI is scheduled for 8/22. If this is OK with you to wait until then.?

## 2016-08-27 NOTE — Telephone Encounter (Signed)
I left a detailed message with the information below at the pts cell number. 

## 2016-08-27 NOTE — Telephone Encounter (Signed)
If any further symptoms or recurrence should go to ER for urgent imaging in the interim. O/w can wait until then if that is the first available.

## 2016-09-09 ENCOUNTER — Inpatient Hospital Stay: Admission: RE | Admit: 2016-09-09 | Payer: Medicare Other | Source: Ambulatory Visit

## 2016-09-09 ENCOUNTER — Other Ambulatory Visit: Payer: Medicare Other

## 2016-09-09 ENCOUNTER — Ambulatory Visit
Admission: RE | Admit: 2016-09-09 | Discharge: 2016-09-09 | Disposition: A | Discharge status: Home / Self Care | Payer: Medicare Other | Source: Ambulatory Visit | Attending: Family Medicine | Admitting: Family Medicine

## 2016-09-09 ENCOUNTER — Telehealth: Payer: Self-pay | Admitting: *Deleted

## 2016-09-09 DIAGNOSIS — H539 Unspecified visual disturbance: Secondary | ICD-10-CM

## 2016-09-09 MED ORDER — GADOBENATE DIMEGLUMINE 529 MG/ML IV SOLN
15.0000 mL | Freq: Once | INTRAVENOUS | Status: AC | PRN
  Administered 2016-09-09: 15 mL via INTRAVENOUS

## 2016-09-09 NOTE — Telephone Encounter (Signed)
Caller name: Zigmund Daniel w/ [facility]  Reason for call: Zigmund Daniel states orders for pt's MRI is not signed and patient is on the table. Chart reviewed, advised that orders appear signed on our end. No further needs identified at time of call.

## 2016-10-08 ENCOUNTER — Encounter: Payer: Self-pay | Admitting: Family Medicine

## 2016-10-22 ENCOUNTER — Encounter: Payer: Medicare Other | Admitting: Family Medicine

## 2016-11-13 DIAGNOSIS — R339 Retention of urine, unspecified: Secondary | ICD-10-CM | POA: Diagnosis not present

## 2016-11-13 DIAGNOSIS — N1330 Unspecified hydronephrosis: Secondary | ICD-10-CM | POA: Diagnosis not present

## 2016-11-13 DIAGNOSIS — N390 Urinary tract infection, site not specified: Secondary | ICD-10-CM | POA: Diagnosis not present

## 2016-11-26 NOTE — Progress Notes (Signed)
Medicare Annual Preventive Care Visit  (initial annual wellness or annual wellness exam)  Concerns and/or follow up today:  Significant for hyperlipidemia, DVT with factor V Leiden deficiency, osteoarthritis, intermittent BPPV and bladder retention.  Reports she is doing well overall.  She unfortunately has a history of a hip replacement and is having elevated levels of metal in her system.  She is seeing her orthopedic doctor, Dr. Reynaldo Minium for this.  They are considering a revision of the right hip.  She has put this off until the spring.  No other concerns today.  She reports she is exercising on a regular basis and eating a very healthy diet. She has refused a statin.  She opted not to check a cholesterol panel given it would not change her treatment.  She also decided to come off treatment for the DVT and only take aspirin, she made this decision with the hematologist. See HM section in Epic for other details of completed HM. See scanned documentation under Media Tab for further documentation HPI, health risk assessment. See Media Tab and Care Teams sections in Epic for other providers.  ROS: negative for report of fevers, unintentional weight loss, vision changes, vision loss, hearing loss or change, chest pain, sob, hemoptysis, melena, hematochezia, hematuria, genital discharge or lesions, falls, bleeding or bruising, loc, thoughts of suicide or self harm, memory loss  1.) Patient-completed health risk assessment  - completed and reviewed, see scanned documentation  2.) Review of Medical History: -PMH, PSH, Family History and current specialty and care providers reviewed and updated and listed below  - see scanned in document in chart and below  Past Medical History:  Diagnosis Date  . Arthritis   . Blood dyscrasia    low platelets  . Cancer ([place])    skin cancer (removed)  . DVT (deep venous thrombosis) ([place])    left lower extremity once in 2015 after long car ride, refused chronic  anticoagulation  . FH: pulmonary embolism 10/12/2013  . Frequent UTI    hx of   . GERD (gastroesophageal reflux disease)   . Heart murmur    was told years ago she had a murmur but no one mentions it now.  Marland Kitchen Heterozygous factor V Leiden mutation ([place])   . History of hiatal hernia   . Hoarseness    sees [facility]  . Intermittent self-catheterization of bladder    chronic urinary retention, sees urologist  . Peripheral vascular disease ([place])   . Vertigo    2016  . Vitamin B deficiency   . Vitamin D deficiency     Past Surgical History:  Procedure Laterality Date  . ABDOMINAL HYSTERECTOMY    . APPENDECTOMY    . BUNIONECTOMY Bilateral   . CATARACT EXTRACTION Left 1999   with lens implant  . CHOLECYSTECTOMY    . COLONOSCOPY W/ POLYPECTOMY    . HERNIA REPAIR    . JOINT REPLACEMENT Right 2007   hip  . TONSILLECTOMY      Social History   Socioeconomic History  . Marital status: Widowed    Spouse name: Not on file  . Number of children: Not on file  . Years of education: Not on file  . Highest education level: Not on file  Social Needs  . Financial resource strain: Not on file  . Food insecurity - worry: Not on file  . Food insecurity - inability: Not on file  . Transportation needs - medical: Not on file  . Transportation needs -  non-medical: Not on file  Occupational History  . Not on file  Tobacco Use  . Smoking status: Never Smoker  . Smokeless tobacco: Never Used  Substance and Sexual Activity  . Alcohol use: Yes    Alcohol/week: 0.0 oz    Comment: glass on a rare occ  . Drug use: No  . Sexual activity: Not on file  Other Topics Concern  . Not on file  Social History Narrative   Work or School: volunteers with music group      Home Situation: lives in [place] [address]      Spiritual Beliefs: Baptist, religious      Lifestyle: active at residence, walking once per week, diet is ok             Family History  Problem Relation Age of  Onset  . Heart disease Mother        chf  . Cancer Father        died at age 49    Current Outpatient Medications on File Prior to Visit  Medication Sig Dispense Refill  . acetaminophen (TYLENOL) 500 MG tablet Take 500 mg by mouth daily as needed (foot pain).     Marland Kitchen aspirin EC 81 MG tablet Take 81 mg by mouth daily.    . Cholecalciferol (VITAMIN D PO) Take 2,000 Units by mouth daily.     . Flaxseed, Linseed, (FLAX SEED OIL PO) Take 1 tablet by mouth daily.     . Loratadine (ALAVERT PO) Take 1 tablet by mouth daily as needed (for allergies).     . meclizine (ANTIVERT) 25 MG tablet Take 1 tablet (25 mg total) by mouth 3 (three) times daily as needed for dizziness. 30 tablet 0  . Omega-3 Fatty Acids (OMEGA 3 PO) Take by mouth.    . vitamin B-12 (CYANOCOBALAMIN) 1000 MCG tablet Take 1,000 mcg by mouth daily.     No current facility-administered medications on file prior to visit.      3.) Review of functional ability and level of safety:  Any difficulty hearing?  See scanned documentation  History of falling?  See scanned documentation  Any trouble with IADLs - using a phone, using transportation, grocery shopping, preparing meals, doing housework, doing laundry, taking medications and managing money?  See scanned documentation  Advance Directives?  Discussed briefly and offered more resources and detailed discussion with our trained staff.   See summary of recommendations in Patient Instructions below.  4.) Physical Exam Vitals:   11/27/16 0845  BP: 100/78  Pulse: 73  Temp: (!) 97.3 F (36.3 C)   Estimated body mass index is 26.85 kg/m as calculated from the following:   Height as of this encounter: 5' 3.75" (1.619 m).   Weight as of this encounter: 155 lb 3.2 oz (70.4 kg).  EKG (optional): deferred  General: alert, appear well hydrated and in no acute distress  HEENT: visual acuity grossly intact  CV: HRRR  Lungs: CTA bilaterally  Psych: pleasant and  cooperative, no obvious depression or anxiety  Cognitive function grossly intact  See patient instructions for recommendations.  Education and counseling regarding the above review of health provided with a plan for the following: -see scanned patient completed form for further details -fall prevention strategies discussed  -healthy lifestyle discussed -importance and resources for completing advanced directives discussed -see patient instructions below for any other recommendations provided  4)The following written screening schedule of preventive measures were reviewed with assessment and plan made  per below, orders and patient instructions:      AAA screening done if applicable     Alcohol screening done     Obesity Screening and counseling done     STI screening (Hep C if born 50-65) offered and per pt wishes     Tobacco Screening done done       Pneumococcal (PPSV23 -one dose after 64, one before if risk factors), influenza yearly and hepatitis B vaccines (if high risk - end stage renal disease, IV drugs, homosexual men, live in home for mentally retarded, hemophilia receiving factors) ASSESSMENT/PLAN: done if applicable      Screening mammograph (yearly if >40) ASSESSMENT/PLAN: utd  -last done in February 2018      Screening Pap smear/pelvic exam (q2 years) ASSESSMENT/PLAN: n/a, declined         Colorectal cancer screening (FOBT yearly or flex sig q4y or colonoscopy q10y or barium enema q4y) ASSESSMENT/PLAN: Finished in 2014      Diabetes outpatient self-management training services ASSESSMENT/PLAN: utd or done      Bone mass measurements(covered q2y if indicated - estrogen def, osteoporosis, hyperparathyroid, vertebral abnormalities, osteoporosis or steroids) ASSESSMENT/PLAN:last bone density test was in 2016 and was normal      Screening for glaucoma(q1y if high risk - diabetes, FH, AA and > 50 or hispanic and > 65) ASSESSMENT/PLAN: utd or advised      Medical  nutritional therapy for individuals with diabetes or renal disease ASSESSMENT/PLAN: see orders      Cardiovascular screening blood tests (lipids q5y) ASSESSMENT/PLAN: see orders and labs      Diabetes screening tests ASSESSMENT/PLAN: see orders and labs   7.) Summary:   Medicare annual wellness visit, subsequent -risk factors and conditions per above assessment were discussed and treatment, recommendations and referrals were offered per documentation above and orders and patient instructions.  Abnormal CBC - Plan: CBC with Differential/Platelet  Hyperglycemia screening- Plan: Hemoglobin A1c   Patient Instructions   BEFORE YOU LEAVE: -follow up: Yearly for annual wellness visit with Manuela Schwartz in follow-up with Dr. Maudie Mercury -Labs -Flu shot  We have ordered labs or studies at this visit. It can take up to 1-2 weeks for results and processing. IF results require follow up or explanation, we will call you with instructions. Clinically stable results will be released to your [facility]. If you have not heard from [place] or cannot find your results in [facility] in 2 weeks please contact our office at [phone].  If you are not yet signed up for [facility], please consider signing up.   Ms. Oyervides , Thank you for taking time to come for your Medicare Wellness Visit. I appreciate your ongoing commitment to your health goals. Please review the following plan we discussed and let me know if I can assist you in the future.   These are the goals we discussed: Goals    None      This is a list of the screening recommended for you and due dates:  Health Maintenance  Topic Date Due  . Flu Shot   given today  . Colon Cancer Screening  01/19/2017  . Mammogram  03/12/2017  . Tetanus Vaccine  02/20/2024  . Pneumonia vaccines  Completed  . DEXA scan (bone density measurement)  Addressed             Colin Benton R., DO

## 2016-11-27 ENCOUNTER — Encounter: Payer: Self-pay | Admitting: Family Medicine

## 2016-11-27 ENCOUNTER — Ambulatory Visit (INDEPENDENT_AMBULATORY_CARE_PROVIDER_SITE_OTHER): Payer: Medicare Other | Admitting: Family Medicine

## 2016-11-27 VITALS — BP 100/78 | HR 73 | Temp 97.3°F | Ht 63.75 in | Wt 155.2 lb

## 2016-11-27 DIAGNOSIS — R7989 Other specified abnormal findings of blood chemistry: Secondary | ICD-10-CM | POA: Diagnosis not present

## 2016-11-27 DIAGNOSIS — Z0000 Encounter for general adult medical examination without abnormal findings: Secondary | ICD-10-CM

## 2016-11-27 DIAGNOSIS — Z23 Encounter for immunization: Secondary | ICD-10-CM

## 2016-11-27 DIAGNOSIS — R739 Hyperglycemia, unspecified: Secondary | ICD-10-CM | POA: Diagnosis not present

## 2016-11-27 LAB — HEMOGLOBIN A1C: HEMOGLOBIN A1C: 5.8 % (ref 4.6–6.5)

## 2016-11-27 LAB — CBC WITH DIFFERENTIAL/PLATELET
BASOS ABS: 0 10*3/uL (ref 0.0–0.1)
Basophils Relative: 0.8 % (ref 0.0–3.0)
EOS ABS: 0.1 10*3/uL (ref 0.0–0.7)
Eosinophils Relative: 1.5 % (ref 0.0–5.0)
HCT: 42.6 % (ref 36.0–46.0)
HEMOGLOBIN: 13.6 g/dL (ref 12.0–15.0)
LYMPHS PCT: 28.7 % (ref 12.0–46.0)
Lymphs Abs: 1.4 10*3/uL (ref 0.7–4.0)
MCHC: 32.1 g/dL (ref 30.0–36.0)
MCV: 83.8 fl (ref 78.0–100.0)
MONOS PCT: 8.5 % (ref 3.0–12.0)
Monocytes Absolute: 0.4 10*3/uL (ref 0.1–1.0)
NEUTROS ABS: 3 10*3/uL (ref 1.4–7.7)
NEUTROS PCT: 60.5 % (ref 43.0–77.0)
Platelets: 142 10*3/uL — ABNORMAL LOW (ref 150.0–400.0)
RBC: 5.08 Mil/uL (ref 3.87–5.11)
RDW: 14.6 % (ref 11.5–15.5)
WBC: 4.9 10*3/uL (ref 4.0–10.5)

## 2016-11-27 NOTE — Patient Instructions (Signed)
BEFORE YOU LEAVE: -follow up: Yearly for annual wellness visit with Diane Roach in follow-up with Dr. Maudie Mercury -Labs -Flu shot  We have ordered labs or studies at this visit. It can take up to 1-2 weeks for results and processing. IF results require follow up or explanation, we will call you with instructions. Clinically stable results will be released to your [facility]. If you have not heard from [place] or cannot find your results in [facility] in 2 weeks please contact our office at [phone].  If you are not yet signed up for [facility], please consider signing up.   Diane Roach , Thank you for taking time to come for your Medicare Wellness Visit. I appreciate your ongoing commitment to your health goals. Please review the following plan we discussed and let me know if I can assist you in the future.   These are the goals we discussed: Goals    None      This is a list of the screening recommended for you and due dates:  Health Maintenance  Topic Date Due  . Flu Shot   given today  . Colon Cancer Screening  01/19/2017  . Mammogram  03/12/2017  . Tetanus Vaccine  02/20/2024  . Pneumonia vaccines  Completed  . DEXA scan (bone density measurement)  Addressed

## 2016-11-27 NOTE — Addendum Note (Signed)
Addended by: Agnes Lawrence on: 11/27/2016 09:34 AM   Modules accepted: Orders

## 2017-01-07 ENCOUNTER — Other Ambulatory Visit: Payer: Self-pay | Admitting: *Deleted

## 2017-01-07 NOTE — Patient Outreach (Signed)
[facility]) Care Management  01/07/2017  Diane Roach 01-17-1931 [id number]   Erroneous Encounter.  Nat Christen, BSW, MSW, LCSW  Licensed Education officer, [facility]  Mailing [place] [address], [place], [place] [address] Physical Address-[address], [place], [place] [address] Toll Free Main # [phone] Fax # [phone] Cell # [phone]  Office # [phone] [email or web address]

## 2017-01-26 ENCOUNTER — Telehealth: Payer: Self-pay | Admitting: *Deleted

## 2017-01-26 DIAGNOSIS — H2511 Age-related nuclear cataract, right eye: Secondary | ICD-10-CM | POA: Diagnosis not present

## 2017-01-26 DIAGNOSIS — H5203 Hypermetropia, bilateral: Secondary | ICD-10-CM | POA: Diagnosis not present

## 2017-01-26 NOTE — Telephone Encounter (Signed)
Dr Maudie Mercury received a fax from [facility] to be completed for a surgical clearance and stated the Diane Roach needs an office visit prior to this being completed.  I left a detailed message at the pts cell number to call for an appt.

## 2017-01-29 ENCOUNTER — Encounter: Payer: Self-pay | Admitting: *Deleted

## 2017-01-29 ENCOUNTER — Ambulatory Visit (INDEPENDENT_AMBULATORY_CARE_PROVIDER_SITE_OTHER): Payer: Medicare Other | Admitting: Family Medicine

## 2017-01-29 ENCOUNTER — Encounter: Payer: Self-pay | Admitting: Family Medicine

## 2017-01-29 VITALS — BP 140/80 | HR 77 | Temp 97.5°F | Ht 63.75 in | Wt 155.5 lb

## 2017-01-29 DIAGNOSIS — M25551 Pain in right hip: Secondary | ICD-10-CM | POA: Diagnosis not present

## 2017-01-29 DIAGNOSIS — Z01818 Encounter for other preprocedural examination: Secondary | ICD-10-CM | POA: Diagnosis not present

## 2017-01-29 NOTE — Patient Instructions (Signed)
We will sent the assessment and plan and form to your surgeon's office.

## 2017-01-29 NOTE — Progress Notes (Signed)
No chief complaint on file.   HPI:  Patient is seen for optimization of general medical care prior to surgery. Surgery type: R hip revision Date of surgery: 04/07/2017  Kidney disease? No Prior surgeries/Issues following anesthesia? Hip surgery 2007, foot surgeries, hysterectomy, appendectomy, cholecystectomy - only issue is nausea after anesthesia post op Hx MI, heart arrythmia, CHF, angina or stroke? none Epilepsy or Seizures? none Arthritis or problems with neck or jaw? none Thyroid disease? none Liver disease? none Asthma, COPD or chronic lung disease? none Diabetes? none (Needs to be evaluated by anesthesia if yes to these questions.)  Other: Poor nutrition, Frail or other: no Chronic urinary retention - self caths 3x daily - sees urologist for managment Hx of DVT with Factor V Leiden mutation - sees hematologist  METS:  ?Can take care of self, such as eat, dress, or use the toilet (1 MET). yes ?Can walk up a flight of steps or a hill (4 METs).yes ?Can do heavy work around the house such as scrubbing floors or lifting or moving heavy furniture (between 4 and 10 METs). yes ?Can participate in strenuous sports such as swimming, singles tennis, football, basketball, and skiing (>10 METs) - not sure . AHA Risks: Major predictors that require intensive management and may lead to delay in or cancellation of the operative procedure unless emergent: NONE  . Unstable coronary syndromes including unstable or severe angina or recent MI  . Decompensated heart failure including NYHA functional class IV or worsening or new-onset HF  . Significant arrhythmias including high grade AV block, symptomatic ventricular arrhythmias, supraventricular arrhythmias with ventricular rate >100 bpm at rest, symptomatic bradycardia, and newly recognized ventricular tachycardia  . Severe heart valve disease including severe aortic stenosis or symptomatic mitral stenosis   Other clinical predictors that  warrant careful assessment of current status: NONE  . History of ischemic heart disease . History of cerebrovascular disease  . History of compensated heart failure or prior heart failure  . Diabetes mellitus  . Renal insufficiency  Type of surgery and Risk: 1) High risk (reported risk of cardiac death or nonfatal myocardial infarction [MI] often greater than 5 percent):  Marland Kitchen Aortic and other major vascular surgery  . Peripheral artery surgery   2)Intermediate risk (reported risk of cardiac death or nonfatal MI generally 1 to 5 percent):  Marland Kitchen Carotid endarterectomy  . Head and neck surgery  . Intraperitoneal and intrathoracic surgery  . Orthopedic surgery  . Prostate surgery   3)Low risk (reported risk of cardiac death or nonfatal MI generally less than 1 percent):  Marland Kitchen Ambulatory surgery  . Endoscopic procedures  . Superficial procedure  . Cataract surgery  . Breast surgery  Medications that need to be addressed prior to surgery: None Discontinue acei/arbs/non-statin lipid lowering drugs day of surgery ASA stop 7 days before or discuss with cardiology if CV risks, other anticoagulants discuss with cardiology.  ROS: See pertinent positives and negatives per HPI. 11 point ROS negative except where noted.  Past Medical History:  Diagnosis Date  . Arthritis   . Blood dyscrasia    low platelets  . Cancer ([place])    skin cancer (removed)  . DVT (deep venous thrombosis) ([place])    left lower extremity once in 2015 after long car ride, refused chronic anticoagulation  . FH: pulmonary embolism 10/12/2013  . Frequent UTI    hx of   . GERD (gastroesophageal reflux disease)   . Heart murmur    was told years ago  she had a murmur but no one mentions it now.  Marland Kitchen Heterozygous factor V Leiden mutation ([place])   . History of hiatal hernia   . Hoarseness    sees [facility]  . Intermittent self-catheterization of bladder    chronic urinary retention, sees urologist  . Peripheral vascular  disease ([place])   . Vertigo    2016  . Vitamin B deficiency   . Vitamin D deficiency     Past Surgical History:  Procedure Laterality Date  . ABDOMINAL HYSTERECTOMY    . APPENDECTOMY    . BUNIONECTOMY Bilateral   . CATARACT EXTRACTION Left 1999   with lens implant  . CHOLECYSTECTOMY    . COLONOSCOPY W/ POLYPECTOMY    . HERNIA REPAIR    . JOINT REPLACEMENT Right 2007   hip  . MICROLARYNGOSCOPY W/VOCAL CORD INJECTION N/A 04/30/2014   Procedure: MICROLARYNGOSCOPY WITH VOCAL CORD INJECTION;  Surgeon: Melida Quitter, MD;  Location: [facility];  Service: ENT;  Laterality: N/A;  Micro Direct Laryngoscopy with Prolaryn injection/jet ventilation  . MICROLARYNGOSCOPY W/VOCAL CORD INJECTION Bilateral 05/24/2015   Procedure: Micro Direct Laryngoscopy with Vocal Fold Prolaryn Injection/Jet Venturi Ventilation;  Surgeon: Melida Quitter, MD;  Location: [facility];  Service: ENT;  Laterality: Bilateral;  . TONSILLECTOMY      Family History  Problem Relation Age of Onset  . Heart disease Mother        chf  . Cancer Father        died at age 61    Social History   Socioeconomic History  . Marital status: Widowed    Spouse name: None  . Number of children: None  . Years of education: None  . Highest education level: None  Social Needs  . Financial resource strain: None  . Food insecurity - worry: None  . Food insecurity - inability: None  . Transportation needs - medical: None  . Transportation needs - non-medical: None  Occupational History  . None  Tobacco Use  . Smoking status: Never Smoker  . Smokeless tobacco: Never Used  Substance and Sexual Activity  . Alcohol use: Yes    Alcohol/week: 0.0 oz    Comment: glass on a rare occ  . Drug use: No  . Sexual activity: None  Other Topics Concern  . None  Social History Narrative   Work or School: volunteers with music group      Home Situation: lives in [place], religious      Lifestyle: active  at residence, walking once per week, diet is ok              Current Outpatient Medications:  .  acetaminophen (TYLENOL) 500 MG tablet, Take 500 mg by mouth daily as needed (foot pain). , Disp: , Rfl:  .  aspirin EC 81 MG tablet, Take 81 mg by mouth daily., Disp: , Rfl:  .  Cholecalciferol (VITAMIN D PO), Take 2,000 Units by mouth daily. , Disp: , Rfl:  .  Flaxseed, Linseed, (FLAX SEED OIL PO), Take 1 tablet by mouth daily. , Disp: , Rfl:  .  Loratadine (ALAVERT PO), Take 1 tablet by mouth daily as needed (for allergies). , Disp: , Rfl:  .  meclizine (ANTIVERT) 25 MG tablet, Take 1 tablet (25 mg total) by mouth 3 (three) times daily as needed for dizziness., Disp: 30 tablet, Rfl: 0 .  Omega-3 Fatty Acids (OMEGA 3 PO), Take by mouth., Disp: ,  Rfl:  .  vitamin B-12 (CYANOCOBALAMIN) 1000 MCG tablet, Take 1,000 mcg by mouth daily., Disp: , Rfl:   EXAM:  Vitals:   01/29/17 1259  BP: 140/80  Pulse: 77  Temp: (!) 97.5 F (36.4 C)    Body mass index is 26.9 kg/m.  GENERAL: vitals reviewed and listed above, alert, oriented, appears well hydrated and in no acute distress  HEENT: atraumatic, conjunttiva clear, no obvious abnormalities on inspection of external nose and ears  NECK: no obvious masses on inspection, no carotid bruits  LUNGS: clear to auscultation bilaterally, no wheezes, rales or rhonchi, good air movement  CV: HRRR, no peripheral edema, no JVD, BP normal range, normal radial pulses  MS: moves all extremities without noticeable abnormality  PSYCH: pleasant and cooperative, no obvious depression or anxiety  More than 50% of over 30 minutes spent in total in caring for this patient was spent face-to-face with the patient, counseling and/or coordinating care.    ASSESSMENT AND PLAN:  Discussed the following assessment and plan:  Preop examination  Pain of right hip joint  Assessment: -Risk factors/comorbidities:  Chronic urinary retention - self caths 3x daily  - sees urologist for managment Hx of DVT with Factor V Leiden mutation  -Surgery Risks:intermediate -age, nutritional status, fraility: good nutritional status, age >18, no fraility -functional capacity: > 4 - 6 METs wethout symptoms -comorbidities: none Patient Specific Risks: patient is low risk for intermediate risks surgery   Recommendations for optimizing general medical care prior to surgery: -advised patient to discuss specific risks morbidity and mortality of surgery with surgeon, CV risks discussed with patient -advised patient will defer to surgeon for post-op DVT prophylaxis and post op care -stop asa and fish oil 1 week before surgery or per surgeon's recommendations -suggest cbc, bmp and ekg shortly prior to surgery - will defer to surgical anesthesia team unless they feel not warrented -no recommendations to defer surgery for further CV testing prior to surgery -these recommendations and form for pre-op optimization of general medical care prior to surgery faxed to surgeon office    Patient Instructions  We will sent the assessment and plan and form to your surgeon's office.    Colin Benton R.

## 2017-02-04 ENCOUNTER — Other Ambulatory Visit: Payer: Self-pay | Admitting: Family Medicine

## 2017-02-04 DIAGNOSIS — Z139 Encounter for screening, unspecified: Secondary | ICD-10-CM

## 2017-02-19 DIAGNOSIS — Z96641 Presence of right artificial hip joint: Secondary | ICD-10-CM | POA: Diagnosis not present

## 2017-03-04 ENCOUNTER — Encounter (HOSPITAL_COMMUNITY): Payer: Self-pay | Admitting: *Deleted

## 2017-03-14 ENCOUNTER — Ambulatory Visit: Payer: Self-pay | Admitting: Orthopedic Surgery

## 2017-03-15 ENCOUNTER — Ambulatory Visit
Admission: RE | Admit: 2017-03-15 | Discharge: 2017-03-15 | Disposition: A | Discharge status: Home / Self Care | Payer: Medicare Other | Source: Ambulatory Visit | Attending: Family Medicine | Admitting: Family Medicine

## 2017-03-15 DIAGNOSIS — Z139 Encounter for screening, unspecified: Secondary | ICD-10-CM

## 2017-03-15 DIAGNOSIS — Z1231 Encounter for screening mammogram for malignant neoplasm of breast: Secondary | ICD-10-CM | POA: Diagnosis not present

## 2017-03-26 NOTE — Progress Notes (Signed)
01-29-17 Surgical clearance in Epic and on chart from Dr. Maudie Mercury

## 2017-03-26 NOTE — Patient Instructions (Signed)
Diane Roach  03/26/2017   Your procedure is scheduled on: 04-07-17   Report to [facility] Main  Entrance Report to Admitting at 11:15 AM     Call this number if you have problems the morning of surgery [phone]   Remember: Do not eat food or drink liquids :After Midnight. You may have a Clear Liquid Diet from Midnight until 7:45 AM. After 7:45 AM, nothing until after surgery      CLEAR LIQUID DIET   Foods Allowed                                                                     Foods Excluded  Coffee and tea, regular and decaf                             liquids that you cannot  Plain Jell-O in any flavor                                             see through such as: Fruit ices (not with fruit pulp)                                     milk, soups, orange juice  Iced Popsicles                                    All solid food Carbonated beverages, regular and diet                                    Cranberry, grape and apple juices Sports drinks like Gatorade Lightly seasoned clear broth or consume(fat free) Sugar, honey syrup  Sample Menu Breakfast                                Lunch                                     Supper Cranberry juice                    Beef broth                            Chicken broth Jell-O                                     Grape juice  Apple juice Coffee or tea                        Jell-O                                      Popsicle                                                Coffee or tea                        Coffee or tea  _____________________________________________________________________    Take these medicines the morning of surgery with A SIP OF WATER: None                                You may not have any metal on your body including hair pins and              piercings  Do not wear jewelry, make-up, lotions, powders or perfumes, deodorant             Do not wear  nail polish.  Do not shave  48 hours prior to surgery.               Do not bring valuables to the hospital. [place].  Contacts, dentures or bridgework may not be worn into surgery.  Leave suitcase in the car. After surgery it may be brought to your room.                  Please read over the following fact sheets you were given: _____________________________________________________________________           [facility] - Preparing for Surgery Before surgery, you can play an important role.  Because skin is not sterile, your skin needs to be as free of germs as possible.  You can reduce the number of germs on your skin by washing with CHG (chlorahexidine gluconate) soap before surgery.  CHG is an antiseptic cleaner which kills germs and bonds with the skin to continue killing germs even after washing. Please DO NOT use if you have an allergy to CHG or antibacterial soaps.  If your skin becomes reddened/irritated stop using the CHG and inform your nurse when you arrive at Short Stay. Do not shave (including legs and underarms) for at least 48 hours prior to the first CHG shower.  You may shave your face/neck. Please follow these instructions carefully:  1.  Shower with CHG Soap the night before surgery and the  morning of Surgery.  2.  If you choose to wash your hair, wash your hair first as usual with your  normal  shampoo.  3.  After you shampoo, rinse your hair and body thoroughly to remove the  shampoo.                           4.  Use CHG as you would any other liquid soap.  You can apply chg directly  to  the skin and wash                       Gently with a scrungie or clean washcloth.  5.  Apply the CHG Soap to your body ONLY FROM THE NECK DOWN.   Do not use on face/ open                           Wound or open sores. Avoid contact with eyes, ears mouth and genitals (private parts).                       Wash face,  Genitals (private  parts) with your normal soap.             6.  Wash thoroughly, paying special attention to the area where your surgery  will be performed.  7.  Thoroughly rinse your body with warm water from the neck down.  8.  DO NOT shower/wash with your normal soap after using and rinsing off  the CHG Soap.                9.  Pat yourself dry with a clean towel.            10.  Wear clean pajamas.            11.  Place clean sheets on your bed the night of your first shower and do not  sleep with pets. Day of Surgery : Do not apply any lotions/deodorants the morning of surgery.  Please wear clean clothes to the hospital/surgery center.  FAILURE TO FOLLOW THESE INSTRUCTIONS MAY RESULT IN THE CANCELLATION OF YOUR SURGERY PATIENT SIGNATURE_________________________________  NURSE SIGNATURE__________________________________  ________________________________________________________________________   [name]  An incentive spirometer is a tool that can help keep your lungs clear and active. This tool measures how well you are filling your lungs with each breath. Taking long deep breaths may help reverse or decrease the chance of developing breathing (pulmonary) problems (especially infection) following:  A long period of time when you are unable to move or be active. BEFORE THE PROCEDURE   If the spirometer includes an indicator to show your best effort, your nurse or respiratory therapist will set it to a desired goal.  If possible, sit up straight or lean slightly forward. Try not to slouch.  Hold the incentive spirometer in an upright position. INSTRUCTIONS FOR USE  1. Sit on the edge of your bed if possible, or sit up as far as you can in bed or on a chair. 2. Hold the incentive spirometer in an upright position. 3. Breathe out normally. 4. Place the mouthpiece in your mouth and seal your lips tightly around it. 5. Breathe in slowly and as deeply as possible, raising the piston or the  ball toward the top of the column. 6. Hold your breath for 3-5 seconds or for as long as possible. Allow the piston or ball to fall to the bottom of the column. 7. Remove the mouthpiece from your mouth and breathe out normally. 8. Rest for a few seconds and repeat Steps 1 through 7 at least 10 times every 1-2 hours when you are awake. Take your time and take a few normal breaths between deep breaths. 9. The spirometer may include an indicator to show your best effort. Use the indicator as a goal to work toward during each repetition. 10. After each  set of 10 deep breaths, practice coughing to be sure your lungs are clear. If you have an incision (the cut made at the time of surgery), support your incision when coughing by placing a pillow or rolled up towels firmly against it. Once you are able to get out of bed, walk around indoors and cough well. You may stop using the incentive spirometer when instructed by your caregiver.  RISKS AND COMPLICATIONS  Take your time so you do not get dizzy or light-headed.  If you are in pain, you may need to take or ask for pain medication before doing incentive spirometry. It is harder to take a deep breath if you are having pain. AFTER USE  Rest and breathe slowly and easily.  It can be helpful to keep track of a log of your progress. Your caregiver can provide you with a simple table to help with this. If you are using the spirometer at home, follow these instructions: [place] IF:   You are having difficultly using the spirometer.  You have trouble using the spirometer as often as instructed.  Your pain medication is not giving enough relief while using the spirometer.  You develop fever of 100.5 F (38.1 C) or higher. SEEK IMMEDIATE MEDICAL CARE IF:   You cough up bloody sputum that had not been present before.  You develop fever of 102 F (38.9 C) or greater.  You develop worsening pain at or near the incision site. MAKE SURE YOU:    Understand these instructions.  Will watch your condition.  Will get help right away if you are not doing well or get worse. Document Released: 05/18/2006 Document Revised: 03/30/2011 Document Reviewed: 07/19/2006 ExitCare Patient Information 2014 ExitCare, [place].   ________________________________________________________________________  WHAT IS A BLOOD TRANSFUSION? Blood Transfusion Information  A transfusion is the replacement of blood or some of its parts. Blood is made up of multiple cells which provide different functions.  Red blood cells carry oxygen and are used for blood loss replacement.  White blood cells fight against infection.  Platelets control bleeding.  Plasma helps clot blood.  Other blood products are available for specialized needs, such as hemophilia or other clotting disorders. BEFORE THE TRANSFUSION  Who gives blood for transfusions?   Healthy volunteers who are fully evaluated to make sure their blood is safe. This is blood bank blood. Transfusion therapy is the safest it has ever been in the practice of medicine. Before blood is taken from a donor, a complete history is taken to make sure that person has no history of diseases nor engages in risky social behavior (examples are intravenous drug use or sexual activity with multiple partners). The donor's travel history is screened to minimize risk of transmitting infections, such as malaria. The donated blood is tested for signs of infectious diseases, such as HIV and hepatitis. The blood is then tested to be sure it is compatible with you in order to minimize the chance of a transfusion reaction. If you or a relative donates blood, this is often done in anticipation of surgery and is not appropriate for emergency situations. It takes many days to process the donated blood. RISKS AND COMPLICATIONS Although transfusion therapy is very safe and saves many lives, the main dangers of transfusion include:    Getting an infectious disease.  Developing a transfusion reaction. This is an allergic reaction to something in the blood you were given. Every precaution is taken to prevent this. The decision to have  a blood transfusion has been considered carefully by your caregiver before blood is given. Blood is not given unless the benefits outweigh the risks. AFTER THE TRANSFUSION  Right after receiving a blood transfusion, you will usually feel much better and more energetic. This is especially true if your red blood cells have gotten low (anemic). The transfusion raises the level of the red blood cells which carry oxygen, and this usually causes an energy increase.  The nurse administering the transfusion will monitor you carefully for complications. HOME CARE INSTRUCTIONS  No special instructions are needed after a transfusion. You may find your energy is better. Speak with your caregiver about any limitations on activity for underlying diseases you may have. SEEK MEDICAL CARE IF:   Your condition is not improving after your transfusion.  You develop redness or irritation at the intravenous (IV) site. SEEK IMMEDIATE MEDICAL CARE IF:  Any of the following symptoms occur over the next 12 hours:  Shaking chills.  You have a temperature by mouth above 102 F (38.9 C), not controlled by medicine.  Chest, back, or muscle pain.  People around you feel you are not acting correctly or are confused.  Shortness of breath or difficulty breathing.  Dizziness and fainting.  You get a rash or develop hives.  You have a decrease in urine output.  Your urine turns a dark color or changes to pink, red, or brown. Any of the following symptoms occur over the next 10 days:  You have a temperature by mouth above 102 F (38.9 C), not controlled by medicine.  Shortness of breath.  Weakness after normal activity.  The white part of the eye turns yellow (jaundice).  You have a decrease in the  amount of urine or are urinating less often.  Your urine turns a dark color or changes to pink, red, or brown. Document Released: 01/03/2000 Document Revised: 03/30/2011 Document Reviewed: 08/22/2007 [facility] Patient Information 2014 [place], [place].  _______________________________________________________________________

## 2017-03-29 ENCOUNTER — Encounter (HOSPITAL_COMMUNITY)
Admission: RE | Admit: 2017-03-29 | Discharge: 2017-03-29 | Disposition: A | Discharge status: Home / Self Care | Payer: Medicare Other | Source: Ambulatory Visit | Attending: Orthopedic Surgery | Admitting: Orthopedic Surgery

## 2017-03-29 ENCOUNTER — Other Ambulatory Visit: Payer: Self-pay

## 2017-03-29 ENCOUNTER — Encounter (HOSPITAL_COMMUNITY): Payer: Self-pay

## 2017-03-29 DIAGNOSIS — E785 Hyperlipidemia, unspecified: Secondary | ICD-10-CM | POA: Diagnosis not present

## 2017-03-29 DIAGNOSIS — R42 Dizziness and giddiness: Secondary | ICD-10-CM | POA: Diagnosis not present

## 2017-03-29 DIAGNOSIS — M25551 Pain in right hip: Secondary | ICD-10-CM | POA: Diagnosis not present

## 2017-03-29 DIAGNOSIS — R339 Retention of urine, unspecified: Secondary | ICD-10-CM | POA: Diagnosis not present

## 2017-03-29 DIAGNOSIS — Z01812 Encounter for preprocedural laboratory examination: Secondary | ICD-10-CM | POA: Insufficient documentation

## 2017-03-29 DIAGNOSIS — R490 Dysphonia: Secondary | ICD-10-CM | POA: Diagnosis not present

## 2017-03-29 DIAGNOSIS — Z8679 Personal history of other diseases of the circulatory system: Secondary | ICD-10-CM | POA: Diagnosis not present

## 2017-03-29 DIAGNOSIS — H539 Unspecified visual disturbance: Secondary | ICD-10-CM | POA: Insufficient documentation

## 2017-03-29 DIAGNOSIS — Z77018 Contact with and (suspected) exposure to other hazardous metals: Secondary | ICD-10-CM | POA: Diagnosis not present

## 2017-03-29 DIAGNOSIS — Z01810 Encounter for preprocedural cardiovascular examination: Secondary | ICD-10-CM | POA: Diagnosis not present

## 2017-03-29 DIAGNOSIS — D6851 Activated protein C resistance: Secondary | ICD-10-CM | POA: Diagnosis not present

## 2017-03-29 HISTORY — DX: Nausea with vomiting, unspecified: Z98.890

## 2017-03-29 HISTORY — DX: Nausea with vomiting, unspecified: R11.2

## 2017-03-29 HISTORY — DX: Adverse effect of unspecified anesthetic, initial encounter: T41.45XA

## 2017-03-29 HISTORY — DX: Other complications of anesthesia, initial encounter: T88.59XA

## 2017-03-29 LAB — CBC
HEMATOCRIT: 42.4 % (ref 36.0–46.0)
HEMOGLOBIN: 13.4 g/dL (ref 12.0–15.0)
MCH: 26.8 pg (ref 26.0–34.0)
MCHC: 31.6 g/dL (ref 30.0–36.0)
MCV: 84.8 fL (ref 78.0–100.0)
Platelets: 157 10*3/uL (ref 150–400)
RBC: 5 MIL/uL (ref 3.87–5.11)
RDW: 14.4 % (ref 11.5–15.5)
WBC: 5.5 10*3/uL (ref 4.0–10.5)

## 2017-03-29 LAB — COMPREHENSIVE METABOLIC PANEL
ALBUMIN: 3.7 g/dL (ref 3.5–5.0)
ALK PHOS: 57 U/L (ref 38–126)
ALT: 14 U/L (ref 14–54)
ANION GAP: 7 (ref 5–15)
AST: 22 U/L (ref 15–41)
BILIRUBIN TOTAL: 0.6 mg/dL (ref 0.3–1.2)
BUN: 17 mg/dL (ref 6–20)
CALCIUM: 9.5 mg/dL (ref 8.9–10.3)
CO2: 29 mmol/L (ref 22–32)
Chloride: 104 mmol/L (ref 101–111)
Creatinine, Ser: 0.68 mg/dL (ref 0.44–1.00)
GFR calc Af Amer: >60 mL/min (ref >60)
GLUCOSE: 96 mg/dL (ref 65–99)
POTASSIUM: 4.1 mmol/L (ref 3.5–5.1)
Sodium: 140 mmol/L (ref 135–145)
TOTAL PROTEIN: 6.4 g/dL — AB (ref 6.5–8.1)

## 2017-03-29 LAB — PROTIME-INR
INR: 0.96
PROTHROMBIN TIME: 12.7 s (ref 11.4–15.2)

## 2017-03-29 LAB — ABO/RH: ABO/RH(D): O POS

## 2017-03-29 LAB — SURGICAL PCR SCREEN
MRSA, PCR: NEGATIVE
STAPHYLOCOCCUS AUREUS: NEGATIVE

## 2017-03-29 LAB — APTT: aPTT: 29 seconds (ref 24–36)

## 2017-03-30 ENCOUNTER — Ambulatory Visit: Payer: Self-pay | Admitting: Orthopedic Surgery

## 2017-03-30 NOTE — Progress Notes (Signed)
Final ekg done 03-29-17 in epic

## 2017-03-30 NOTE — H&P (View-Only) (Signed)
Name Diane Roach, Diane Roach (20EX, F) DOB 1930-09-28   Chief Complaint Right hip pain and metallosis H&P right THA revision vs bearing surface revision 04/07/2017  Patient's Pharmacies [facility] [address] [facility]): [address] [place] [address], Ph [phone], Fax [phone]   Vitals Ht: 5 ft 4 in  Wt: 156 lbs  BMI: 26.8  BP: 132/64  Pulse: 68 bpm   Allergies Reviewed Allergies CORTISONE  PREDNISONE    Medications Reviewed Medications aspirin 02/19/17   entered [place] 03/26/17   entered Aasha Dina, PA-C Salmon Oil 03/26/17   entered Vermell Madrid, PA-C Vitamin D3 03/26/17   entered Orlander Norwood, PA-C            Problems Reviewed Problems Total replacement of right hip joint - Onset: 02/19/2017 Pain in right hip joint - Onset: 02/19/2017 History of total replacement of right hip joint - Onset: 02/21/2017   Family History Reviewed Family History Mother - Mother deceased   - Diabetes mellitus Brother - Diabetes mellitus Unspecified Relation - Hereditary factor V deficiency disease - Granddaughter Son - Pulmonary embolism   Social History Reviewed Social History Smoking Status: Never smoker Non-smoker Chewing tobacco: none Alcohol intake: Occasional Hand Dominance: Right Work related injury?: N Advance directive: Y (Notes: Living Will) Medical Power of Attorney: Y   Surgical History Reviewed Surgical History Cholecystectomy Hernia Repair - 2009 Bunionectomy - 2002, 2005 Hysterectomy - 1970 Appendectomy - 1950 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 Most Recent Mammogram: 02/29/2016.   Past Medical History Reviewed Past Medical History Hypertension: Y Notes: Imparied Vision (Glasses),  Tinnitus,  Vertigo,  Cataract,  Heart Murmur,  Hiatal Hernia,  Urinary Retention,  Self-catheterizations,   Factor V Leiden Thrombophilia,  Postmenopausal   HPI Patient is scheduled for a right total hip revision versus bearing surface revision by Dr. Wynelle Link on 04/07/2017 at [facility]. The patient had her right total hip replacement done back in 2007. She has been followed for the last couple years for the metal on metal issues. Her cobalt/chromium levels increased slightly as noted in her last labs, last July. She has had increase pain with the right hip. She feels unstable with the right hip. Ms. Poznanski has a metal-on-metal hip and has had increased discomfort. She has had some elevated cobalt-chromium levels. She is wanting to get this revised as we have been speaking about it for a couple years now. She is now ready to proceed with surgery at this time. 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 We discussed a bearing surface versus total hip arthroplasty revision. I told her that more than likely we would be able to just revise the bearing surface. We discussed all this in detail including procedure risks potential complications and rehab course and she elects to proceed with revision surgery at this time.   ROS Reported by Patient CONSTITUTIONAL: no Fever, no Chills, no Night Sweats, no Weight Loss  CARDIOVASCULAR: no Cough, no Shortness of Breath, no COPD, no Asthma  GASTROINTESTINAL: no Vomiting, no Nausea  MUSCULOSKELETAL: JOINT PAIN, SWELLING IN JOINTS  NEUROLOGIC: NUMBNESS, DIFFICULTY WITH BALANCE, but no Tingling/Paresthesias  Physical Exam Patient is an 84 year old female. General Mental Status - Alert, cooperative and good historian. General Appearance - pleasant, Not in acute distress. Orientation - Oriented X3. Build & Nutrition - Well nourished and Well developed.  Head and Neck Head - normocephalic, atraumatic . Neck  Global Assessment - supple, no bruit auscultated on the right, no bruit auscultated on the  left.  Eye Pupil - Bilateral - PERR Motion - Bilateral - EOMI.  Chest and Lung Exam Auscultation Breath sounds - clear at anterior chest wall and clear at posterior chest wall. Adventitious sounds - No Adventitious sounds.  Cardiovascular Auscultation Rhythm - Regular rate and rhythm. Heart Sounds - S1 WNL and S2 WNL. Murmurs & Other Heart Sounds - Auscultation of the heart reveals - No Murmurs.  Abdomen Palpation/Percussion Tenderness - Abdomen is non-tender to palpation. Abdomen is soft. Auscultation Auscultation of the abdomen reveals - Bowel sounds normal.  Genitourinary Note: Not done, not pertinent to present illness  Musculoskeletal Her RIGHT hip to be flexed to 110, rotate and 20, out 30 and abduct 30 without discomfort. She is very tender over the greater trochanter. She has a slightly antalgic gait.  Her radiographs AP pelvis and lateral of the RIGHT hip show the prosthesis in good position. There is no evidence of loosening or lysis. She has a metal-on-metal Pinnacle construct. She has a Summit stem which shows no evidence of loosening.   Assessment / Plan 1. 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 History of total replacement of right hip joint Z96.641: Presence of right artificial hip joint  Patient Instructions Surgical Plans: Right Total Hip Arthroplasty Revision versus Right Total Hip Bearing Surface Revision Disposition: Home PCP: Dr. Colin Benton Topical TXA - History of DVT, Factor V Deficiency Anesthesia Issues: Issues with Nausea  Patient was instructed on what medications to stop prior to surgery. - Follow up visit in 2 weeks with Dr. Wynelle Link - Begin physical therapy following surgery - Pre-operative lab work as pre Pre-Surgical Testing - Prescriptions will be provided in hospital at time of discharge  Return to [place], MD for [place] at [facility] on 04/20/2017 at 01:30 PM  Encounter signed-off by Mickel Crow, PA-C

## 2017-03-30 NOTE — H&P (Signed)
Name Diane Roach, Diane Roach (45PI, F) DOB June 28, 1930   Chief Complaint Right hip pain and metallosis H&P right THA revision vs bearing surface revision 04/07/2017  Patient's Pharmacies [place] [address] [facility]): [address] [place] [address], Ph [phone], Fax [phone]   Vitals Ht: 5 ft 4 in  Wt: 156 lbs  BMI: 26.8  BP: 132/64  Pulse: 68 bpm   Allergies Reviewed Allergies CORTISONE  PREDNISONE    Medications Reviewed Medications aspirin 02/19/17   entered [place] 03/26/17   entered Audreyana Huntsberry, PA-C Salmon Oil 03/26/17   entered Cannan Beeck, PA-C Vitamin D3 03/26/17   entered Laveyah Oriol, PA-C            Problems Reviewed Problems Total replacement of right hip joint - Onset: 02/19/2017 Pain in right hip joint - Onset: 02/19/2017 History of total replacement of right hip joint - Onset: 02/21/2017   Family History Reviewed Family History Mother - Mother deceased   - Diabetes mellitus Brother - Diabetes mellitus Unspecified Relation - Hereditary factor V deficiency disease - Granddaughter Son - Pulmonary embolism   Social History Reviewed Social History Smoking Status: Never smoker Non-smoker Chewing tobacco: none Alcohol intake: Occasional Hand Dominance: Right Work related injury?: N Advance directive: Y (Notes: Living Will) Medical Power of Attorney: Y   Surgical History Reviewed Surgical History Cholecystectomy Hernia Repair - 2009 Bunionectomy - 2002, 2005 Hysterectomy - 1970 Appendectomy - 1950 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 Most Recent Mammogram: 02/29/2016.   Past Medical History Reviewed Past Medical History Hypertension: Y Notes: Imparied Vision (Glasses),  Tinnitus,  Vertigo,  Cataract,  Heart Murmur,  Hiatal Hernia,  Urinary Retention,  Self-catheterizations,   Factor V Leiden Thrombophilia,  Postmenopausal   HPI Patient is scheduled for a right total hip revision versus bearing surface revision by Dr. Wynelle Link on 04/07/2017 at [facility]. The patient had her right total hip replacement done back in 2007. She has been followed for the last couple years for the metal on metal issues. Her cobalt/chromium levels increased slightly as noted in her last labs, last July. She has had increase pain with the right hip. She feels unstable with the right hip. Diane Roach has a metal-on-metal hip and has had increased discomfort. She has had some elevated cobalt-chromium levels. She is wanting to get this revised as we have been speaking about it for a couple years now. She is now ready to proceed with surgery at this time. 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 We discussed a bearing surface versus total hip arthroplasty revision. I told her that more than likely we would be able to just revise the bearing surface. We discussed all this in detail including procedure risks potential complications and rehab course and she elects to proceed with revision surgery at this time.   ROS Reported by Patient CONSTITUTIONAL: no Fever, no Chills, no Night Sweats, no Weight Loss  CARDIOVASCULAR: no Cough, no Shortness of Breath, no COPD, no Asthma  GASTROINTESTINAL: no Vomiting, no Nausea  MUSCULOSKELETAL: JOINT PAIN, SWELLING IN JOINTS  NEUROLOGIC: NUMBNESS, DIFFICULTY WITH BALANCE, but no Tingling/Paresthesias  Physical Exam Patient is an 84 year old female. General Mental Status - Alert, cooperative and good historian. General Appearance - pleasant, Not in acute distress. Orientation - Oriented X3. Build & Nutrition - Well nourished and Well developed.  Head and Neck Head - normocephalic, atraumatic . Neck  Global Assessment - supple, no bruit auscultated on the right, no bruit auscultated on the  left.  Eye Pupil - Bilateral - PERR Motion - Bilateral - EOMI.  Chest and Lung Exam Auscultation Breath sounds - clear at anterior chest wall and clear at posterior chest wall. Adventitious sounds - No Adventitious sounds.  Cardiovascular Auscultation Rhythm - Regular rate and rhythm. Heart Sounds - S1 WNL and S2 WNL. Murmurs & Other Heart Sounds - Auscultation of the heart reveals - No Murmurs.  Abdomen Palpation/Percussion Tenderness - Abdomen is non-tender to palpation. Abdomen is soft. Auscultation Auscultation of the abdomen reveals - Bowel sounds normal.  Genitourinary Note: Not done, not pertinent to present illness  Musculoskeletal Her RIGHT hip to be flexed to 110, rotate and 20, out 30 and abduct 30 without discomfort. She is very tender over the greater trochanter. She has a slightly antalgic gait.  Her radiographs AP pelvis and lateral of the RIGHT hip show the prosthesis in good position. There is no evidence of loosening or lysis. She has a metal-on-metal Pinnacle construct. She has a Summit stem which shows no evidence of loosening.   Assessment / Plan 1. 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 History of total replacement of right hip joint Z96.641: Presence of right artificial hip joint  Patient Instructions Surgical Plans: Right Total Hip Arthroplasty Revision versus Right Total Hip Bearing Surface Revision Disposition: Home PCP: Dr. Colin Benton Topical TXA - History of DVT, Factor V Deficiency Anesthesia Issues: Issues with Nausea  Patient was instructed on what medications to stop prior to surgery. - Follow up visit in 2 weeks with Dr. Wynelle Link - Begin physical therapy following surgery - Pre-operative lab work as pre Pre-Surgical Testing - Prescriptions will be provided in hospital at time of discharge  Return to [place], MD for [place] at [facility] on 04/20/2017 at 01:30 PM  Encounter signed-off by Mickel Crow, PA-C

## 2017-04-06 MED ORDER — TRANEXAMIC ACID 1000 MG/10ML IV SOLN
2000.0000 mg | INTRAVENOUS | Status: DC
  Filled 2017-04-06: qty 20

## 2017-04-07 ENCOUNTER — Encounter (HOSPITAL_COMMUNITY)
Admission: RE | Disposition: A | Discharge status: Home Health | Payer: Self-pay | Source: Ambulatory Visit | Attending: Orthopedic Surgery

## 2017-04-07 ENCOUNTER — Other Ambulatory Visit: Payer: Self-pay

## 2017-04-07 ENCOUNTER — Inpatient Hospital Stay (HOSPITAL_COMMUNITY): Payer: Medicare Other | Admitting: Certified Registered Nurse Anesthetist

## 2017-04-07 ENCOUNTER — Inpatient Hospital Stay (HOSPITAL_COMMUNITY)
Admission: RE | Admit: 2017-04-07 | Discharge: 2017-04-09 | DRG: 467 | Disposition: A | Discharge status: Home Health | Payer: Medicare Other | Source: Ambulatory Visit | Attending: Orthopedic Surgery | Admitting: Orthopedic Surgery

## 2017-04-07 ENCOUNTER — Encounter (HOSPITAL_COMMUNITY): Payer: Self-pay | Admitting: *Deleted

## 2017-04-07 ENCOUNTER — Inpatient Hospital Stay (HOSPITAL_COMMUNITY): Payer: Medicare Other

## 2017-04-07 DIAGNOSIS — K219 Gastro-esophageal reflux disease without esophagitis: Secondary | ICD-10-CM | POA: Diagnosis not present

## 2017-04-07 DIAGNOSIS — Z96649 Presence of unspecified artificial hip joint: Secondary | ICD-10-CM

## 2017-04-07 DIAGNOSIS — I739 Peripheral vascular disease, unspecified: Secondary | ICD-10-CM | POA: Diagnosis present

## 2017-04-07 DIAGNOSIS — T84060A Wear of articular bearing surface of internal prosthetic right hip joint, initial encounter: Secondary | ICD-10-CM | POA: Diagnosis not present

## 2017-04-07 DIAGNOSIS — E539 Vitamin B deficiency, unspecified: Secondary | ICD-10-CM | POA: Diagnosis not present

## 2017-04-07 DIAGNOSIS — T84018S Broken internal joint prosthesis, other site, sequela: Secondary | ICD-10-CM

## 2017-04-07 DIAGNOSIS — Z90710 Acquired absence of both cervix and uterus: Secondary | ICD-10-CM

## 2017-04-07 DIAGNOSIS — M25551 Pain in right hip: Secondary | ICD-10-CM | POA: Diagnosis not present

## 2017-04-07 DIAGNOSIS — Z880 Allergy status to penicillin: Secondary | ICD-10-CM | POA: Diagnosis not present

## 2017-04-07 DIAGNOSIS — D6851 Activated protein C resistance: Secondary | ICD-10-CM | POA: Diagnosis present

## 2017-04-07 DIAGNOSIS — Z471 Aftercare following joint replacement surgery: Secondary | ICD-10-CM | POA: Diagnosis not present

## 2017-04-07 DIAGNOSIS — Y792 Prosthetic and other implants, materials and accessory orthopedic devices associated with adverse incidents: Secondary | ICD-10-CM | POA: Diagnosis present

## 2017-04-07 DIAGNOSIS — I10 Essential (primary) hypertension: Secondary | ICD-10-CM | POA: Diagnosis present

## 2017-04-07 DIAGNOSIS — Z881 Allergy status to other antibiotic agents status: Secondary | ICD-10-CM | POA: Diagnosis not present

## 2017-04-07 DIAGNOSIS — Z833 Family history of diabetes mellitus: Secondary | ICD-10-CM

## 2017-04-07 DIAGNOSIS — T84090A Other mechanical complication of internal right hip prosthesis, initial encounter: Secondary | ICD-10-CM | POA: Diagnosis not present

## 2017-04-07 DIAGNOSIS — Z9049 Acquired absence of other specified parts of digestive tract: Secondary | ICD-10-CM

## 2017-04-07 DIAGNOSIS — E559 Vitamin D deficiency, unspecified: Secondary | ICD-10-CM | POA: Diagnosis present

## 2017-04-07 DIAGNOSIS — Z85828 Personal history of other malignant neoplasm of skin: Secondary | ICD-10-CM

## 2017-04-07 DIAGNOSIS — K449 Diaphragmatic hernia without obstruction or gangrene: Secondary | ICD-10-CM | POA: Diagnosis not present

## 2017-04-07 DIAGNOSIS — Z7982 Long term (current) use of aspirin: Secondary | ICD-10-CM | POA: Diagnosis not present

## 2017-04-07 DIAGNOSIS — R339 Retention of urine, unspecified: Secondary | ICD-10-CM | POA: Diagnosis present

## 2017-04-07 DIAGNOSIS — Z888 Allergy status to other drugs, medicaments and biological substances status: Secondary | ICD-10-CM

## 2017-04-07 DIAGNOSIS — T84018A Broken internal joint prosthesis, other site, initial encounter: Secondary | ICD-10-CM

## 2017-04-07 DIAGNOSIS — Z96641 Presence of right artificial hip joint: Secondary | ICD-10-CM | POA: Diagnosis not present

## 2017-04-07 HISTORY — PX: TOTAL HIP REVISION: SHX763

## 2017-04-07 LAB — TYPE AND SCREEN
ABO/RH(D): O POS
Antibody Screen: NEGATIVE

## 2017-04-07 SURGERY — TOTAL HIP REVISION
Anesthesia: Spinal | Site: Hip | Laterality: Right

## 2017-04-07 MED ORDER — CEFAZOLIN SODIUM-DEXTROSE 2-4 GM/100ML-% IV SOLN
2.0000 g | INTRAVENOUS | Status: DC
  Filled 2017-04-07: qty 100

## 2017-04-07 MED ORDER — PHENYLEPHRINE HCL 10 MG/ML IJ SOLN
INTRAMUSCULAR | Status: AC
  Filled 2017-04-07: qty 1

## 2017-04-07 MED ORDER — TRANEXAMIC ACID 1000 MG/10ML IV SOLN
INTRAVENOUS | Status: AC | PRN
  Administered 2017-04-07: 2000 mg via TOPICAL

## 2017-04-07 MED ORDER — DOCUSATE SODIUM 100 MG PO CAPS
100.0000 mg | ORAL_CAPSULE | Freq: Two times a day (BID) | ORAL | Status: DC
  Administered 2017-04-07 – 2017-04-09 (×4): 100 mg via ORAL
  Filled 2017-04-07 (×4): qty 1

## 2017-04-07 MED ORDER — ACETAMINOPHEN 10 MG/ML IV SOLN
1000.0000 mg | Freq: Once | INTRAVENOUS | Status: AC
  Administered 2017-04-07: 1000 mg via INTRAVENOUS
  Filled 2017-04-07: qty 100

## 2017-04-07 MED ORDER — METHOCARBAMOL 500 MG PO TABS
500.0000 mg | ORAL_TABLET | Freq: Four times a day (QID) | ORAL | Status: DC | PRN
  Administered 2017-04-08 – 2017-04-09 (×4): 500 mg via ORAL
  Filled 2017-04-07 (×4): qty 1

## 2017-04-07 MED ORDER — METHOCARBAMOL 1000 MG/10ML IJ SOLN
500.0000 mg | Freq: Four times a day (QID) | INTRAMUSCULAR | Status: DC | PRN
  Administered 2017-04-07: 500 mg via INTRAVENOUS
  Filled 2017-04-07: qty 550

## 2017-04-07 MED ORDER — BUPIVACAINE IN DEXTROSE 0.75-8.25 % IT SOLN
INTRATHECAL | Status: DC | PRN
Start: 2017-04-07 — End: 2017-04-07
  Administered 2017-04-07: 2 mL via INTRATHECAL

## 2017-04-07 MED ORDER — PROPOFOL 10 MG/ML IV BOLUS
INTRAVENOUS | Status: AC
  Filled 2017-04-07: qty 20

## 2017-04-07 MED ORDER — LIDOCAINE 2% (20 MG/ML) 5 ML SYRINGE
INTRAMUSCULAR | Status: AC
  Filled 2017-04-07: qty 5

## 2017-04-07 MED ORDER — FLEET ENEMA 7-19 GM/118ML RE ENEM: 1.0000 | ENEMA | Freq: Once | RECTAL | Status: DC | PRN

## 2017-04-07 MED ORDER — BISACODYL 10 MG RE SUPP: 10.0000 mg | Freq: Every day | RECTAL | Status: DC | PRN

## 2017-04-07 MED ORDER — PHENYLEPHRINE 40 MCG/ML (10ML) SYRINGE FOR IV PUSH (FOR BLOOD PRESSURE SUPPORT)
PREFILLED_SYRINGE | INTRAVENOUS | Status: DC | PRN
  Administered 2017-04-07 (×2): 80 ug via INTRAVENOUS
  Administered 2017-04-07: 40 ug via INTRAVENOUS

## 2017-04-07 MED ORDER — PROPOFOL 500 MG/50ML IV EMUL
INTRAVENOUS | Status: DC | PRN
  Administered 2017-04-07: 25 ug/kg/min via INTRAVENOUS

## 2017-04-07 MED ORDER — FENTANYL CITRATE (PF) 100 MCG/2ML IJ SOLN
25.0000 ug | INTRAMUSCULAR | Status: DC | PRN
  Administered 2017-04-07 (×4): 25 ug via INTRAVENOUS

## 2017-04-07 MED ORDER — EPHEDRINE 5 MG/ML INJ
INTRAVENOUS | Status: AC
  Filled 2017-04-07: qty 10

## 2017-04-07 MED ORDER — BUPIVACAINE HCL 0.25 % IJ SOLN
INTRAMUSCULAR | Status: DC | PRN
  Administered 2017-04-07: 30 mL

## 2017-04-07 MED ORDER — POLYETHYLENE GLYCOL 3350 17 G PO PACK: 17.0000 g | PACK | Freq: Every day | ORAL | Status: DC | PRN

## 2017-04-07 MED ORDER — MENTHOL 3 MG MT LOZG
1.0000 | LOZENGE | OROMUCOSAL | Status: DC | PRN
  Filled 2017-04-07 (×2): qty 9

## 2017-04-07 MED ORDER — BUPIVACAINE HCL (PF) 0.25 % IJ SOLN
INTRAMUSCULAR | Status: AC
  Filled 2017-04-07: qty 30

## 2017-04-07 MED ORDER — DEXTROSE 5 % IV SOLN
INTRAVENOUS | Status: DC | PRN
  Administered 2017-04-07: 50 ug/min via INTRAVENOUS

## 2017-04-07 MED ORDER — CEFAZOLIN SODIUM-DEXTROSE 2-3 GM-%(50ML) IV SOLR
INTRAVENOUS | Status: DC | PRN
  Administered 2017-04-07: 2 g via INTRAVENOUS

## 2017-04-07 MED ORDER — MORPHINE SULFATE (PF) 2 MG/ML IV SOLN
0.5000 mg | INTRAVENOUS | Status: DC | PRN
  Administered 2017-04-07: 1 mg via INTRAVENOUS
  Filled 2017-04-07: qty 1

## 2017-04-07 MED ORDER — TRAMADOL HCL 50 MG PO TABS
50.0000 mg | ORAL_TABLET | Freq: Four times a day (QID) | ORAL | Status: DC | PRN
  Filled 2017-04-07: qty 2

## 2017-04-07 MED ORDER — SODIUM CHLORIDE 0.9 % IV SOLN
INTRAVENOUS | Status: DC
  Administered 2017-04-07: 18:00:00 via INTRAVENOUS

## 2017-04-07 MED ORDER — ONDANSETRON HCL 4 MG/2ML IJ SOLN
INTRAMUSCULAR | Status: AC
  Filled 2017-04-07: qty 2

## 2017-04-07 MED ORDER — HYDROCODONE-ACETAMINOPHEN 7.5-325 MG PO TABS
1.0000 | ORAL_TABLET | ORAL | Status: DC | PRN
  Administered 2017-04-07: 1 via ORAL
  Administered 2017-04-08 (×2): 2 via ORAL
  Filled 2017-04-07 (×3): qty 1
  Filled 2017-04-07: qty 2

## 2017-04-07 MED ORDER — ONDANSETRON HCL 4 MG PO TABS
4.0000 mg | ORAL_TABLET | Freq: Four times a day (QID) | ORAL | Status: DC | PRN
  Administered 2017-04-08: 4 mg via ORAL
  Filled 2017-04-07: qty 1

## 2017-04-07 MED ORDER — LIDOCAINE HCL (CARDIAC) 20 MG/ML IV SOLN
INTRAVENOUS | Status: DC | PRN
  Administered 2017-04-07: 50 mg via INTRAVENOUS

## 2017-04-07 MED ORDER — HYDROCODONE-ACETAMINOPHEN 5-325 MG PO TABS
1.0000 | ORAL_TABLET | ORAL | Status: DC | PRN
  Administered 2017-04-08 – 2017-04-09 (×5): 2 via ORAL
  Filled 2017-04-07 (×5): qty 2

## 2017-04-07 MED ORDER — CEFAZOLIN SODIUM-DEXTROSE 2-4 GM/100ML-% IV SOLN
2.0000 g | Freq: Four times a day (QID) | INTRAVENOUS | Status: AC
  Administered 2017-04-07 – 2017-04-08 (×2): 2 g via INTRAVENOUS
  Filled 2017-04-07 (×2): qty 100

## 2017-04-07 MED ORDER — CHLORHEXIDINE GLUCONATE 4 % EX LIQD: 60.0000 mL | Freq: Once | CUTANEOUS | Status: DC

## 2017-04-07 MED ORDER — EPHEDRINE SULFATE-NACL 50-0.9 MG/10ML-% IV SOSY
PREFILLED_SYRINGE | INTRAVENOUS | Status: DC | PRN
  Administered 2017-04-07 (×4): 5 mg via INTRAVENOUS

## 2017-04-07 MED ORDER — ONDANSETRON HCL 4 MG/2ML IJ SOLN: 4.0000 mg | Freq: Four times a day (QID) | INTRAMUSCULAR | Status: DC | PRN

## 2017-04-07 MED ORDER — PHENOL 1.4 % MT LIQD: 1.0000 | OROMUCOSAL | Status: DC | PRN

## 2017-04-07 MED ORDER — METOCLOPRAMIDE HCL 5 MG PO TABS: 5.0000 mg | ORAL_TABLET | Freq: Three times a day (TID) | ORAL | Status: DC | PRN

## 2017-04-07 MED ORDER — RIVAROXABAN 10 MG PO TABS
10.0000 mg | ORAL_TABLET | Freq: Every day | ORAL | Status: DC
  Administered 2017-04-08 – 2017-04-09 (×2): 10 mg via ORAL
  Filled 2017-04-07 (×2): qty 1

## 2017-04-07 MED ORDER — STERILE WATER FOR IRRIGATION IR SOLN
Status: DC | PRN
  Administered 2017-04-07: 2000 mL

## 2017-04-07 MED ORDER — DIPHENHYDRAMINE HCL 12.5 MG/5ML PO ELIX: 12.5000 mg | ORAL_SOLUTION | ORAL | Status: DC | PRN

## 2017-04-07 MED ORDER — PROPOFOL 10 MG/ML IV BOLUS
INTRAVENOUS | Status: DC | PRN
  Administered 2017-04-07: 20 mg via INTRAVENOUS
  Administered 2017-04-07 (×2): 10 mg via INTRAVENOUS

## 2017-04-07 MED ORDER — PHENYLEPHRINE 40 MCG/ML (10ML) SYRINGE FOR IV PUSH (FOR BLOOD PRESSURE SUPPORT)
PREFILLED_SYRINGE | INTRAVENOUS | Status: AC
  Filled 2017-04-07: qty 10

## 2017-04-07 MED ORDER — ONDANSETRON HCL 4 MG/2ML IJ SOLN
INTRAMUSCULAR | Status: DC | PRN
  Administered 2017-04-07: 4 mg via INTRAVENOUS

## 2017-04-07 MED ORDER — LACTATED RINGERS IV SOLN
INTRAVENOUS | Status: DC
  Administered 2017-04-07 (×2): via INTRAVENOUS

## 2017-04-07 MED ORDER — SODIUM CHLORIDE 0.9 % IR SOLN
Status: DC | PRN
  Administered 2017-04-07: 1000 mL

## 2017-04-07 MED ORDER — METOCLOPRAMIDE HCL 5 MG/ML IJ SOLN: 5.0000 mg | Freq: Three times a day (TID) | INTRAMUSCULAR | Status: DC | PRN

## 2017-04-07 MED ORDER — FENTANYL CITRATE (PF) 100 MCG/2ML IJ SOLN
INTRAMUSCULAR | Status: AC
  Filled 2017-04-07: qty 2

## 2017-04-07 SURGICAL SUPPLY — 63 items
BAG DECANTER FOR FLEXI CONT (MISCELLANEOUS) ×2 IMPLANT
BAG ZIPLOCK 12X15 (MISCELLANEOUS) ×6 IMPLANT
BIT DRILL 2.8X128 (BIT) ×2 IMPLANT
BLADE EXTENDED COATED 6.5IN (ELECTRODE) ×2 IMPLANT
BLADE SAW SGTL 73X25 THK (BLADE) ×2 IMPLANT
COVER SURGICAL LIGHT HANDLE (MISCELLANEOUS) ×2 IMPLANT
CUP ACETAB PIN MULTI 54MM (Orthopedic Implant) ×2 IMPLANT
DRAPE INCISE IOBAN 66X45 STRL (DRAPES) ×2 IMPLANT
DRAPE ORTHO SPLIT 77X108 STRL (DRAPES) ×2
DRAPE POUCH INSTRU U-SHP 10X18 (DRAPES) ×2 IMPLANT
DRAPE SURG ORHT 6 SPLT 77X108 (DRAPES) ×2 IMPLANT
DRAPE U-SHAPE 47X51 STRL (DRAPES) ×2 IMPLANT
DRSG ADAPTIC 3X8 NADH LF (GAUZE/BANDAGES/DRESSINGS) ×2 IMPLANT
DRSG MEPILEX BORDER 4X12 (GAUZE/BANDAGES/DRESSINGS) ×2 IMPLANT
DURAPREP 26ML APPLICATOR (WOUND CARE) ×2 IMPLANT
ELECT REM PT RETURN 15FT ADLT (MISCELLANEOUS) ×2 IMPLANT
EVACUATOR 1/8 PVC DRAIN (DRAIN) ×2 IMPLANT
FACESHIELD WRAPAROUND (MASK) ×8 IMPLANT
GLOVE BIO SURGEON STRL SZ 6.5 (GLOVE) ×2 IMPLANT
GLOVE BIO SURGEON STRL SZ7.5 (GLOVE) ×2 IMPLANT
GLOVE BIO SURGEON STRL SZ8 (GLOVE) ×2 IMPLANT
GLOVE BIOGEL PI IND STRL 7.0 (GLOVE) ×3 IMPLANT
GLOVE BIOGEL PI IND STRL 7.5 (GLOVE) ×1 IMPLANT
GLOVE BIOGEL PI IND STRL 8 (GLOVE) ×3 IMPLANT
GLOVE BIOGEL PI INDICATOR 7.0 (GLOVE) ×3
GLOVE BIOGEL PI INDICATOR 7.5 (GLOVE) ×1
GLOVE BIOGEL PI INDICATOR 8 (GLOVE) ×3
GLOVE SURG SS PI 6.5 STRL IVOR (GLOVE) ×4 IMPLANT
GLOVE SURG SS PI 7.0 STRL IVOR (GLOVE) ×2 IMPLANT
GOWN STRL REUS W/TWL LRG LVL3 (GOWN DISPOSABLE) ×6 IMPLANT
GOWN STRL REUS W/TWL XL LVL3 (GOWN DISPOSABLE) ×4 IMPLANT
HEAD FEMUR METAL 36MM 15.6 HIP (Head) ×1 IMPLANT
IMMOBILIZER KNEE 20 (SOFTGOODS) ×2
IMMOBILIZER KNEE 20 THIGH 36 (SOFTGOODS) ×1 IMPLANT
LINER MARATHON NEUT +4X54X36 (Hips) ×2 IMPLANT
MANIFOLD NEPTUNE II (INSTRUMENTS) ×2 IMPLANT
MARKER SKIN DUAL TIP RULER LAB (MISCELLANEOUS) ×2 IMPLANT
METAL FEMUR HEAD 36MM 15.6 HIP (Head) ×2 IMPLANT
NDL SAFETY ECLIPSE 18X1.5 (NEEDLE) ×1 IMPLANT
NEEDLE HYPO 18GX1.5 SHARP (NEEDLE) ×1
PADDING CAST COTTON 6X4 STRL (CAST SUPPLIES) ×2 IMPLANT
PASSER SUT SWANSON 36MM LOOP (INSTRUMENTS) ×2 IMPLANT
POSITIONER SURGICAL ARM (MISCELLANEOUS) ×2 IMPLANT
SCREW 6.5MMX25MM (Screw) ×4 IMPLANT
SCREW PINNACLE CAN 6.5X40MM (Screw) ×2 IMPLANT
SPONGE LAP 18X18 X RAY DECT (DISPOSABLE) ×2 IMPLANT
STAPLER VISISTAT 35W (STAPLE) ×2 IMPLANT
STRIP CLOSURE SKIN 1/2X4 (GAUZE/BANDAGES/DRESSINGS) ×4 IMPLANT
SUCTION FRAZIER HANDLE 10FR (MISCELLANEOUS) ×1
SUCTION TUBE FRAZIER 10FR DISP (MISCELLANEOUS) ×1 IMPLANT
SUT ETHIBOND NAB CT1 #1 30IN (SUTURE) ×4 IMPLANT
SUT MNCRL AB 4-0 PS2 18 (SUTURE) ×2 IMPLANT
SUT STRATAFIX 0 PDS 27 VIOLET (SUTURE) ×2
SUT VIC AB 1 CT1 27 (SUTURE) ×3
SUT VIC AB 1 CT1 27XBRD ANTBC (SUTURE) ×3 IMPLANT
SUT VIC AB 2-0 CT1 27 (SUTURE) ×3
SUT VIC AB 2-0 CT1 TAPERPNT 27 (SUTURE) ×3 IMPLANT
SUT VLOC 180 0 24IN GS25 (SUTURE) ×4 IMPLANT
SUTURE STRATFX 0 PDS 27 VIOLET (SUTURE) ×1 IMPLANT
SYR 50ML LL SCALE MARK (SYRINGE) ×2 IMPLANT
TOWEL OR 17X26 10 PK STRL BLUE (TOWEL DISPOSABLE) ×4 IMPLANT
TRAY FOLEY W/METER SILVER 16FR (SET/KITS/TRAYS/PACK) ×2 IMPLANT
YANKAUER SUCT BULB TIP 10FT TU (MISCELLANEOUS) ×2 IMPLANT

## 2017-04-07 NOTE — Brief Op Note (Signed)
04/07/2017  2:52 PM  PATIENT:  Fonnie Jarvis  84 y.o. female  PRE-OPERATIVE DIAGNOSIS: FAILED RIGHT TOTAL HIP ARTHROPLASTY  POST-OPERATIVE DIAGNOSIS:  FAILED RIGHT TOTAL HIP ARTHROPLASTY  PROCEDURE:  RIGHT HIP ACETABULAR REVISION  SURGEON:  Surgeon(s) and Role:    Gaynelle Arabian, MD - Primary  PHYSICIAN ASSISTANT:   ASSISTANTS: Arlee Muslim, PA-C   ANESTHESIA:   spinal  EBL:  225 mL   BLOOD ADMINISTERED:none  DRAINS: (Medium) Hemovact drain(s) in the right hip with  Suction Open   LOCAL MEDICATIONS USED:  MARCAINE     COUNTS:  YES  TOURNIQUET:  * No tourniquets in log *  DICTATION: .Other Dictation: Dictation Number [id number]  PLAN OF CARE: Admit to inpatient   PATIENT DISPOSITION:  PACU - hemodynamically stable.

## 2017-04-07 NOTE — Anesthesia Preprocedure Evaluation (Addendum)
Anesthesia Evaluation  Patient identified by MRN, date of birth, ID band Patient awake    Reviewed: Allergy & Precautions, NPO status , Patient's Chart, lab work & pertinent test results  History of Anesthesia Complications (+) PONV  Airway Mallampati: II  TM Distance: >3 FB Neck ROM: Full    Dental   Pulmonary neg pulmonary ROS,    breath sounds clear to auscultation       Cardiovascular + Peripheral Vascular Disease and + DVT   Rhythm:Regular Rate:Normal     Neuro/Psych negative neurological ROS     GI/Hepatic Neg liver ROS, hiatal hernia, GERD  ,  Endo/Other  negative endocrine ROS  Renal/GU negative Renal ROS     Musculoskeletal  (+) Arthritis ,   Abdominal   Peds  Hematology  (+) Blood dyscrasia (Factor V Leiden mutation), ,   Anesthesia Other Findings   Reproductive/Obstetrics                            Lab Results  Component Value Date   WBC 5.5 03/29/2017   HGB 13.4 03/29/2017   HCT 42.4 03/29/2017   MCV 84.8 03/29/2017   PLT 157 03/29/2017   Lab Results  Component Value Date   INR 0.96 03/29/2017   INR 2.0 01/01/2014   INR 5.0 12/21/2013   Lab Results  Component Value Date   CREATININE 0.68 03/29/2017   BUN 17 03/29/2017   NA 140 03/29/2017   K 4.1 03/29/2017   CL 104 03/29/2017   CO2 29 03/29/2017    Anesthesia Physical Anesthesia Plan  ASA: III  Anesthesia Plan: MAC and Spinal   Post-op Pain Management:    Induction: Intravenous  PONV Risk Score and Plan: 3 and Propofol infusion, Dexamethasone, Ondansetron and Treatment may vary due to age or medical condition  Airway Management Planned: Natural Airway and Simple Face Mask  Additional Equipment:   Intra-op Plan:   Post-operative Plan:   Informed Consent: I have reviewed the patients History and Physical, chart, labs and discussed the procedure including the risks, benefits and alternatives for  the proposed anesthesia with the patient or authorized representative who has indicated his/her understanding and acceptance.     Plan Discussed with: CRNA  Anesthesia Plan Comments:        Anesthesia Quick Evaluation

## 2017-04-07 NOTE — Transfer of Care (Signed)
Immediate Anesthesia Transfer of Care Note  Patient: Diane Roach  Procedure(s) Performed: RIGHT HIP ACETABULAR REVISION (Right Hip)  Patient Location: PACU  Anesthesia Type:Spinal  Level of Consciousness: awake  Airway & Oxygen Therapy: Patient Spontanous Breathing and Patient connected to nasal cannula oxygen  Post-op Assessment: Report given to RN and Post -op Vital signs reviewed and stable  Post vital signs: Reviewed and stable  Last Vitals:  Vitals:   04/07/17 1124  BP: 136/79  Pulse: 91  Resp: 18  Temp: 36.4 C  SpO2: 96%    Last Pain:  Vitals:   04/07/17 1140  TempSrc:   PainSc: 5          Complications: No apparent anesthesia complications

## 2017-04-07 NOTE — Anesthesia Procedure Notes (Signed)
Spinal  Patient location during procedure: OR Start time: 04/07/2017 1:05 PM End time: 04/07/2017 1:11 PM Staffing Anesthesiologist: Suzette Battiest, MD Performed: anesthesiologist  Preanesthetic Checklist Completed: patient identified, site marked, surgical consent, pre-op evaluation, timeout performed, IV checked, risks and benefits discussed and monitors and equipment checked Spinal Block Patient position: sitting Prep: site prepped and draped and DuraPrep Patient monitoring: blood pressure, continuous pulse ox and heart rate Approach: midline Location: L4-5 Injection technique: single-shot Needle Needle type: Pencan  Needle gauge: 24 G Needle length: 9 cm

## 2017-04-07 NOTE — Anesthesia Procedure Notes (Signed)
Procedure Name: MAC Date/Time: 04/07/2017 1:04 PM Performed by: Deliah Boston, CRNA Pre-anesthesia Checklist: Patient identified, Emergency Drugs available, Suction available, Patient being monitored and Timeout performed Patient Re-evaluated:Patient Re-evaluated prior to induction Oxygen Delivery Method: Simple face mask Placement Confirmation: positive ETCO2,  CO2 detector and breath sounds checked- equal and bilateral

## 2017-04-07 NOTE — Interval H&P Note (Signed)
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 After consideration of risks, benefits and other options for treatment, the patient has consented to  Procedure(s): Right total hip revision versus bearing surface revision (Right) as a surgical intervention .  The patient's history has been reviewed, patient examined, no change in status, stable for surgery.  I have reviewed the patient's chart and labs.  Questions were answered to the patient's satisfaction.     [name]

## 2017-04-07 NOTE — Anesthesia Postprocedure Evaluation (Signed)
Anesthesia Post Note  Patient: Diane Roach  Procedure(s) Performed: RIGHT HIP ACETABULAR REVISION (Right Hip)     Patient location during evaluation: PACU Anesthesia Type: Spinal Level of consciousness: awake and alert Pain management: pain level controlled Vital Signs Assessment: post-procedure vital signs reviewed and stable Respiratory status: spontaneous breathing and respiratory function stable Cardiovascular status: blood pressure returned to baseline and stable Postop Assessment: spinal receding Anesthetic complications: no    Last Vitals:  Vitals:   04/07/17 1630 04/07/17 1643  BP: (!) 113/55 124/62  Pulse: 72 63  Resp: 15 16  Temp: (!) 36.3 C (!) 36.3 C  SpO2: 99% 100%    Last Pain:  Vitals:   04/07/17 1630  TempSrc:   PainSc: 2                  Tiajuana Amass

## 2017-04-08 LAB — CBC
HEMATOCRIT: 34.7 % — AB (ref 36.0–46.0)
HEMOGLOBIN: 10.8 g/dL — AB (ref 12.0–15.0)
MCH: 26.4 pg (ref 26.0–34.0)
MCHC: 31.1 g/dL (ref 30.0–36.0)
MCV: 84.8 fL (ref 78.0–100.0)
Platelets: 116 10*3/uL — ABNORMAL LOW (ref 150–400)
RBC: 4.09 MIL/uL (ref 3.87–5.11)
RDW: 14.3 % (ref 11.5–15.5)
WBC: 6.6 10*3/uL (ref 4.0–10.5)

## 2017-04-08 LAB — BASIC METABOLIC PANEL
ANION GAP: 7 (ref 5–15)
BUN: 10 mg/dL (ref 6–20)
CHLORIDE: 104 mmol/L (ref 101–111)
CO2: 28 mmol/L (ref 22–32)
Calcium: 8.4 mg/dL — ABNORMAL LOW (ref 8.9–10.3)
Creatinine, Ser: 0.61 mg/dL (ref 0.44–1.00)
GFR calc non Af Amer: >60 mL/min (ref >60)
Glucose, Bld: 128 mg/dL — ABNORMAL HIGH (ref 65–99)
POTASSIUM: 3.9 mmol/L (ref 3.5–5.1)
SODIUM: 139 mmol/L (ref 135–145)

## 2017-04-08 MED ORDER — GABAPENTIN 100 MG PO CAPS
100.0000 mg | ORAL_CAPSULE | Freq: Two times a day (BID) | ORAL | Status: DC
Start: 2017-04-08 — End: 2017-04-09
  Administered 2017-04-08 – 2017-04-09 (×3): 100 mg via ORAL
  Filled 2017-04-08 (×3): qty 1

## 2017-04-08 MED ORDER — SODIUM CHLORIDE 0.9 % IV BOLUS (SEPSIS)
250.0000 mL | Freq: Once | INTRAVENOUS | Status: AC
  Administered 2017-04-08: 14:00:00 via INTRAVENOUS

## 2017-04-08 MED ORDER — SODIUM CHLORIDE 0.9 % IV BOLUS (SEPSIS)
250.0000 mL | Freq: Once | INTRAVENOUS | Status: AC
Start: 2017-04-08 — End: 2017-04-08
  Administered 2017-04-08: 08:00:00 via INTRAVENOUS

## 2017-04-08 MED ORDER — RIVAROXABAN 10 MG PO TABS: 10.0000 mg | ORAL_TABLET | Freq: Every day | ORAL | 0 refills | Status: AC

## 2017-04-08 MED ORDER — HYDROCODONE-ACETAMINOPHEN 5-325 MG PO TABS: 1.0000 | ORAL_TABLET | ORAL | 0 refills | Status: AC | PRN

## 2017-04-08 MED ORDER — TRAMADOL HCL 50 MG PO TABS: 50.0000 mg | ORAL_TABLET | Freq: Four times a day (QID) | ORAL | 0 refills | Status: AC | PRN

## 2017-04-08 MED ORDER — METHOCARBAMOL 500 MG PO TABS: 500.0000 mg | ORAL_TABLET | Freq: Four times a day (QID) | ORAL | 0 refills | Status: AC | PRN

## 2017-04-08 NOTE — Progress Notes (Signed)
Physical Therapy Treatment Patient Details Name: Diane Roach MRN: [id number] DOB: [birth year] Today's Date: 04/08/2017    History of Present Illness revision posterior Right THA due to metallosis    PT Comments    POD # 1 pm session Assisted out of recliner to amb only 8 feet due to dizziness/nausea.  BP decreased from 128/56 in recliner to 78/51 which recliner was following anyway for safety.  Reviewed THP with pt using "teach back" and demonstration.    Follow Up Recommendations  Home health PT(out of town daughter who plans to stay with pt won't arrive until Friday evening)     Equipment Recommendations  Rolling walker with 5" wheels;3in1 (PT)    Recommendations for Other Services       Precautions / Restrictions Precautions Precautions: Posterior Hip Precaution Comments: pt did not recall any THP so re educated and demonstarted usine "Teach back" Restrictions Weight Bearing Restrictions: No RLE Weight Bearing: Weight bearing as tolerated    Mobility  Bed Mobility Overal bed mobility: Needs Assistance Bed Mobility: Supine to Sit     Supine to sit: Min assist     General bed mobility comments: Pt OOB in recliner  Transfers Overall transfer level: Needs assistance Equipment used: Rolling walker (2 wheeled) Transfers: Sit to/from Stand Sit to Stand: Min assist;+2 safety/equipment         General transfer comment: 75% on THP and proper hand placement to rise and lower as well as advancement of R LE.  Ambulation/Gait Ambulation/Gait assistance: Min assist;+2 safety/equipment Ambulation Distance (Feet): 8 Feet Assistive device: Rolling walker (2 wheeled) Gait Pattern/deviations: Step-to pattern;Decreased stance time - right Gait velocity: decreased   General Gait Details: limited amb distance due to increased dizziness with drop in BP from 128/56 to 78/51.  Recliner following for safety.     Stairs            Wheelchair Mobility    Modified  Rankin (Stroke Patients Only)       Balance                                            Cognition Arousal/Alertness: Awake/alert Behavior During Therapy: WFL for tasks assessed/performed Overall Cognitive Status: Within Functional Limits for tasks assessed                                        Exercises      General Comments        Pertinent Vitals/Pain Pain Assessment: 0-10 Pain Score: 6  Pain Location: right hip Pain Descriptors / Indicators: Discomfort Pain Intervention(s): Monitored during session;Repositioned;Ice applied    Home Living Family/patient expects to be discharged to:: Private residence Living Arrangements: Children Available Help at Discharge: Family Type of Home: Apartment Home Access: Level entry   Home Layout: One level Home Equipment: None      Prior Function Level of Independence: Independent          PT Goals (current goals can now be found in the care plan section) Acute Rehab PT Goals Patient Stated Goal: to go home, walk with A 4 wheeled RW PT Goal Formulation: With patient Time For Goal Achievement: 04/15/17 Potential to Achieve Goals: Good Progress towards PT goals: Progressing toward goals    Frequency  7X/week      PT Plan Current plan remains appropriate    Co-evaluation              AM-PAC PT "6 Clicks" Daily Activity  Outcome Measure  Difficulty turning over in bed (including adjusting bedclothes, sheets and blankets)?: A Lot Difficulty moving from lying on back to sitting on the side of the bed? : A Lot Difficulty sitting down on and standing up from a chair with arms (e.g., wheelchair, bedside commode, etc,.)?: A Lot Help needed moving to and from a bed to chair (including a wheelchair)?: A Lot Help needed walking in hospital room?: A Lot Help needed climbing 3-5 steps with a railing? : Total 6 Click Score: 11    End of Session Equipment Utilized During Treatment:  Gait belt Activity Tolerance: Other (comment) Patient left: in chair;with call bell/phone within reach Nurse Communication: Mobility status PT Visit Diagnosis: Unsteadiness on feet (R26.81);Pain Pain - Right/Left: Right Pain - part of body: Hip     Time: 1450-1515 PT Time Calculation (min) (ACUTE ONLY): 25 min  Charges:  $Gait Training: 8-22 mins $Therapeutic Activity: 8-22 mins                    G Codes:       Rica Koyanagi  PTA [facility]  Acute  Rehab Pager      [phone]

## 2017-04-08 NOTE — Plan of Care (Signed)
Reviewed plan of care, specifically pain control, safety, hip precautions, and importance of notifying RN with any questions or concerns. Pt attentive and verbalized understanding of all education.

## 2017-04-08 NOTE — Op Note (Signed)
NAME:  Diane Roach, Diane Roach NO.:  [id number]  MEDICAL RECORD NO.:  [id number]  LOCATION:  PERIO                        FACILITY:  [facility]  PHYSICIAN:  Gaynelle Arabian, M.D.    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  POSTOPERATIVE DIAGNOSIS:  Failed right total hip arthroplasty.  PROCEDURE:  Right acetabular revision.  SURGEON:  Gaynelle Arabian, MD.  ASSISTANT:  Alexzandrew L. Perkins, PA-C.  ANESTHESIA:  Spinal.  ESTIMATED BLOOD LOSS:  200.  DRAINS:  Hemovac x1.  COMPLICATIONS:  None.  CONDITION:  Stable to recovery.  BRIEF CLINICAL NOTE:  [name] is an 84 year old female, had a right total hip arthroplasty done several years ago.  She had a metal on metal construct performed.  She has had persistent discomfort and there was concern about possible metallosis due to testing.  She presents now for a bearing surface versus total hip arthroplasty revision.  PROCEDURE IN DETAIL:  After successful administration of spinal anesthetic, the patient was placed in the left lateral decubitus position with the right side up and right lower extremity was isolated from her perineum with plastic drapes and prepped and draped in the usual sterile fashion.  Standard posterolateral incision was made with a 10 blade through subcutaneous tissue to the level of the fascia lata which was incised in line with the skin incision.  The sciatic nerve was palpated and protected.  The posterior soft tissue cuff was essentially off the femur at this point.  There was a very small amount of metal stained tissue.  I placed the hip through range of motion and she was impinging her femoral neck on the superior aspect of the acetabular shell.  I believe this was causing some of the metal debris.  We dislocated the hip and  removed the femoral head.  The femoral component was well fixed and in good position.  I then retracted the femur anteriorly to gain acetabular exposure.  The acetabulum was in an adducted position and slightly retroverted.  The problem is that it was not imbedded well into the bone and about 50% of the posterior aspect of the acetabulum was in soft tissue and not in the bone.  This was allowing for the impingement to occur and the impingement was causing a problem in the hip.  I decided on the way to correct.  This was to revise the acetabular component.  Acetabular retractors are placed.  Using combination of the curved osteotome and the [place]land osteotomes, the interface between the bone and acetabular shell is disrupted with these osteotomes and the shell removed with minimal to no bone loss.  There was no evidence of any bony deficiency.  A 52 acetabular shell was removed.  I started reaming at 51 to get central and then up to 53.  We had a good engagement of the anterior and posterior columns when I had the 53 reamer in.  I then placed a 54 mm Pinnacle Gription multihole cup.  It was placed in appropriate 40 degrees of anteversion and 20 degrees  of flexion, matching her native anatomy.  There was no overhang of this component. It had excellent purchase when I impacted it and I placed 3 additional screws each with excellent purchase.  I felt that the cup was very stable.  I placed a 36 mm neutral +4 Marathon liner.  We then trialed femoral heads.  A 36, 15.5 was necessary to gain any soft tissue tension.  With that trial, the hip was reduced and there was outstanding stability.  There was full extension, full external rotation, 70 degrees of flexion, 40 degrees of adduction, and 90 degrees of internal rotation, then 90 degrees of flexion and 70 degrees of internal rotation.  By placing the right leg on top of the left, the leg lengths were now equal.  The hip was then dislocated  and the permanent 36 mm, 15.5 metal head was placed.  Hip was reduced with the same stability parameters.  The wound was copiously irrigated with saline solution and then the posterior soft tissues reattached to the femur through drill holes with Ethibond suture.  Fascia lata was closed over Hemovac drain with a running #1 StrataFix suture.  Subcu was injected with 30 mL of 0.25% Marcaine.  Subcu was then closed with the StrataFix and interrupted 2-0 Vicryl.  Subcuticular was closed with running 4-0 Monocryl.  Incision was cleaned and dried.  Steri-Strips and a bulky sterile dressing were applied.  The drain was hooked to suction.  She was then placed into a knee immobilizer, awakened, and transported to recovery in stable condition.  Note that a surgical assistant is a medical necessity for this procedure to do it in a safe and expeditious manner.  Surgical assistant was necessary for retraction of vital neurovascular structures and for proper positioning of the limb to safely remove the old prosthesis and safely and accurately place the new prosthesis.     Gaynelle Arabian, M.D.     FA/MEDQ  D:  04/07/2017  T:  04/08/2017  Job:  [id number]

## 2017-04-08 NOTE — Progress Notes (Signed)
   Subjective: 1 Day Post-Op Procedure(s) (LRB): RIGHT HIP ACETABULAR REVISION (Right) Patient reports pain as moderate.   Patient seen in rounds by Dr. Wynelle Link. Patient is having problems with pain in the hip, requiring pain medications We will start therapy today.  Plan is to go Home after hospital stay.  Objective: Vital signs in last 24 hours: Temp:  [97.4 F (36.3 C)-99 F (37.2 C)] 99 F (37.2 C) (03/21 0600) Pulse Rate:  [63-91] 79 (03/21 0600) Resp:  [13-23] 16 (03/21 0600) BP: (111-140)/(50-89) 123/63 (03/21 0600) SpO2:  [96 %-100 %] 96 % (03/21 0600) Weight:  [70.3 kg (155 lb)] 70.3 kg (155 lb) (03/20 1140)  Intake/Output from previous day:  Intake/Output Summary (Last 24 hours) at 04/08/2017 0718 Last data filed at 04/08/2017 0600 Gross per 24 hour  Intake 3918.75 ml  Output 2715 ml  Net 1203.75 ml    Intake/Output this shift: No intake/output data recorded.  Labs: Recent Labs    04/08/17 0555  HGB 10.8*   Recent Labs    04/08/17 0555  WBC 6.6  RBC 4.09  HCT 34.7*  PLT 116*   Recent Labs    04/08/17 0555  NA 139  K 3.9  CL 104  CO2 28  BUN 10  CREATININE 0.61  GLUCOSE 128*  CALCIUM 8.4*   No results for input(s): LABPT, INR in the last 72 hours.  EXAM General - Patient is Alert, Appropriate and Oriented Extremity - Neurovascular intact Sensation intact distally Intact pulses distally Dorsiflexion/Plantar flexion intact Dressing - dressing C/D/I Motor Function - intact, moving foot and toes well on exam.  Hemovac pulled without difficulty.  Past Medical History:  Diagnosis Date  . Arthritis   . Blood dyscrasia    low platelets  . Cancer ([place])    skin cancer (removed)  . Complication of anesthesia   . DVT (deep venous thrombosis) ([place])    left lower extremity once in 2015 after long car ride, refused chronic anticoagulation  . FH: pulmonary embolism 10/12/2013  . Frequent UTI    hx of   . GERD (gastroesophageal reflux disease)    . Heart murmur    was told years ago she had a murmur but no one mentions it now.  Marland Kitchen Heterozygous factor V Leiden mutation ([place])   . History of hiatal hernia   . Hoarseness    sees [facility]  . Intermittent self-catheterization of bladder    chronic urinary retention, sees urologist  . Peripheral vascular disease ([place])   . PONV (postoperative nausea and vomiting)   . Vertigo    2016  . Vitamin B deficiency   . Vitamin D deficiency     Assessment/Plan: 1 Day Post-Op Procedure(s) (LRB): RIGHT HIP ACETABULAR REVISION (Right) Principal Problem:   Failed total hip arthroplasty (HCC) Active Problems:   Failed total hip arthroplasty, sequela  Estimated body mass index is 27.03 kg/m as calculated from the following:   Height as of this encounter: 5' 3.5" (1.613 m).   Weight as of this encounter: 70.3 kg (155 lb). Advance diet Up with therapy Plan for discharge tomorrow  DVT Prophylaxis - Xarelto Weight Bearing As Tolerated right Leg D/C Knee Immobilizer Hemovac Pulled Begin Therapy Posterior Hip Preacutions  Arlee Muslim, PA-C Orthopaedic Surgery 04/08/2017, 7:18 AM

## 2017-04-08 NOTE — Evaluation (Signed)
Physical Therapy Evaluation Patient Details Name: Diane Roach MRN: [id number] DOB: [birth year] Today's Date: 04/08/2017   History of Present Illness  revision posterior Right THA due to metallosis  Clinical Impression  The patient  Ambulated x 15', complained of nausea. BP 115/51. The patient is requesting a 4 wheeled RW. Advised patient that PT will have to further  Evaluate the safety of rollator prior to Dc Pt admitted with above diagnosis. Pt currently with functional limitations due to the deficits listed below (see PT Problem List).  Pt will benefit from skilled PT to increase their independence and safety with mobility to allow discharge to the venue listed below.       Follow Up Recommendations Home health PT    Equipment Recommendations  Rolling walker with 5" wheels    Recommendations for Other Services       Precautions / Restrictions Precautions Precautions: Posterior Hip Restrictions Weight Bearing Restrictions: No RLE Weight Bearing: Weight bearing as tolerated      Mobility  Bed Mobility Overal bed mobility: Needs Assistance Bed Mobility: Supine to Sit     Supine to sit: Min assist     General bed mobility comments: needs assist for Right leg  Transfers Overall transfer level: Needs assistance Equipment used: 4-wheeled walker Transfers: Sit to/from Stand Sit to Stand: Min assist         General transfer comment: cues for posterior precations, Steady assist to rise. the patient wanted to use 4 wheeled RW , Assist with brakes  Ambulation/Gait Ambulation/Gait assistance: Min assist;+2 safety/equipment Ambulation Distance (Feet): 15 Feet Assistive device: 4-wheeled walker Gait Pattern/deviations: Step-to pattern;Shuffle     General Gait Details: then patient required steady assist with the  4 wheeled Rw, Patient complained of nausea. Recliner broght up. Mildly diaphoretic. BP 115/51. RN aware.  Stairs            Wheelchair Mobility     Modified Rankin (Stroke Patients Only)       Balance                                             Pertinent Vitals/Pain Pain Assessment: 0-10 Pain Score: 4  Pain Location: right hip Pain Descriptors / Indicators: Discomfort Pain Intervention(s): Premedicated before session;Monitored during session;Ice applied    Home Living Family/patient expects to be discharged to:: Private residence Living Arrangements: Children Available Help at Discharge: Family Type of Home: Apartment Home Access: Level entry     Home Layout: One level Home Equipment: None      Prior Function Level of Independence: Independent               Hand Dominance        Extremity/Trunk Assessment   Upper Extremity Assessment Upper Extremity Assessment: Defer to OT evaluation    Lower Extremity Assessment Lower Extremity Assessment: RLE deficits/detail RLE Deficits / Details: able to bear weight and advance    Cervical / Trunk Assessment Cervical / Trunk Assessment: Normal  Communication   Communication: No difficulties  Cognition Arousal/Alertness: Awake/alert Behavior During Therapy: WFL for tasks assessed/performed Overall Cognitive Status: Within Functional Limits for tasks assessed                                        General  Comments      Exercises     Assessment/Plan    PT Assessment Patient needs continued PT services  PT Problem List Decreased strength;Decreased range of motion;Decreased activity tolerance;Decreased safety awareness;Decreased knowledge of precautions;Decreased mobility;Cardiopulmonary status limiting activity;Pain       PT Treatment Interventions DME instruction;Therapeutic exercise;Gait training;Functional mobility training;Therapeutic activities;Patient/family education    PT Goals (Current goals can be found in the Care Plan section)  Acute Rehab PT Goals Patient Stated Goal: to go home, walk with A 4  wheeled RW PT Goal Formulation: With patient Time For Goal Achievement: 04/15/17 Potential to Achieve Goals: Good    Frequency 7X/week   Barriers to discharge        Co-evaluation               AM-PAC PT "6 Clicks" Daily Activity  Outcome Measure Difficulty turning over in bed (including adjusting bedclothes, sheets and blankets)?: A Lot Difficulty moving from lying on back to sitting on the side of the bed? : A Lot Difficulty sitting down on and standing up from a chair with arms (e.g., wheelchair, bedside commode, etc,.)?: A Lot Help needed moving to and from a bed to chair (including a wheelchair)?: A Lot Help needed walking in hospital room?: A Lot Help needed climbing 3-5 steps with a railing? : Total 6 Click Score: 11    End of Session   Activity Tolerance: Patient tolerated treatment well Patient left: in chair;with call bell/phone within reach;with nursing/sitter in room Nurse Communication: Mobility status PT Visit Diagnosis: Unsteadiness on feet (R26.81);Pain Pain - Right/Left: Right Pain - part of body: Hip    Time: 1231-1256 PT Time Calculation (min) (ACUTE ONLY): 25 min   Charges:   PT Evaluation $PT Eval Low Complexity: 1 Low PT Treatments $Gait Training: 8-22 mins   PT G CodesTresa Endo PT [phone]   Claretha Cooper 04/08/2017, 1:47 PM

## 2017-04-08 NOTE — Progress Notes (Addendum)
Discharge planning, spoke with patient at beside. Scheryl Darter for [facility] services, PT to eval and treat. Contacted [facility] for referral. Needs RW and 3-n-1, contacted [facility] to deliver to room. [phone]

## 2017-04-08 NOTE — Discharge Summary (Signed)
   Subjective: 1 Day Post-Op Procedure(s) (LRB): RIGHT HIP ACETABULAR REVISION (Right) Patient reports pain as mild.   Patient seen in rounds with Dr. Wynelle Link. Patient is well, but has had some minor complaints of pain in the hip, requiring pain medications  Objective: Vital signs in last 24 hours: Temp:  [97.4 F (36.3 C)-99 F (37.2 C)] 98.4 F (36.9 C) (03/21 1011) Pulse Rate:  [63-89] 71 (03/21 1254) Resp:  [13-23] 16 (03/21 1011) BP: (102-140)/(50-89) 102/50 (03/21 1254) SpO2:  [96 %-100 %] 99 % (03/21 1011)  Intake/Output from previous day:  Intake/Output Summary (Last 24 hours) at 04/08/2017 1257 Last data filed at 04/08/2017 1012 Gross per 24 hour  Intake 4546.25 ml  Output 3215 ml  Net 1331.25 ml    Intake/Output this shift: Total I/O In: 627.5 [P.O.:240; IV Piggyback:387.5] Out: 500 [Urine:500]  Labs: Recent Labs    04/08/17 0555  HGB 10.8*   Recent Labs    04/08/17 0555  WBC 6.6  RBC 4.09  HCT 34.7*  PLT 116*   Recent Labs    04/08/17 0555  NA 139  K 3.9  CL 104  CO2 28  BUN 10  CREATININE 0.61  GLUCOSE 128*  CALCIUM 8.4*   No results for input(s): LABPT, INR in the last 72 hours.  EXAM: General - Patient is Alert and Appropriate Extremity - Neurovascular intact Sensation intact distally Incision - clean, dry, no drainage Motor Function - intact, moving foot and toes well on exam.   Assessment/Plan: 1 Day Post-Op Procedure(s) (LRB): RIGHT HIP ACETABULAR REVISION (Right) Procedure(s) (LRB): RIGHT HIP ACETABULAR REVISION (Right) Past Medical History:  Diagnosis Date  . Arthritis   . Blood dyscrasia    low platelets  . Cancer ([place])    skin cancer (removed)  . Complication of anesthesia   . DVT (deep venous thrombosis) ([place])    left lower extremity once in 2015 after long car ride, refused chronic anticoagulation  . FH: pulmonary embolism 10/12/2013  . Frequent UTI    hx of   . GERD (gastroesophageal reflux disease)   . Heart  murmur    was told years ago she had a murmur but no one mentions it now.  Marland Kitchen Heterozygous factor V Leiden mutation ([place])   . History of hiatal hernia   . Hoarseness    sees [facility]  . Intermittent self-catheterization of bladder    chronic urinary retention, sees urologist  . Peripheral vascular disease ([place])   . PONV (postoperative nausea and vomiting)   . Vertigo    2016  . Vitamin B deficiency   . Vitamin D deficiency    Principal Problem:   Failed total hip arthroplasty ([place]) Active Problems:   Failed total hip arthroplasty, sequela  Estimated body mass index is 27.03 kg/m as calculated from the following:   Height as of this encounter: 5' 3.5" (1.613 m).   Weight as of this encounter: 70.3 kg (155 lb). Up with therapy  Home when improved Diet - Regular diet Follow up - in 2 weeks Activity - WBAT Disposition - Home Condition Upon Discharge - pending D/C Meds - See DC Summary DVT Prophylaxis - Xarelto  Arlee Muslim, PA-C Orthopaedic Surgery 04/08/2017, 12:57 PM

## 2017-04-08 NOTE — Progress Notes (Signed)
Patient is from [facility]. Patient will follow up with Home Health services at d/c. [place] signing off.   Kathrin Greathouse, Latanya Presser, MSW Clinical Social Worker  [phone] 04/08/2017  11:29 AM

## 2017-04-09 LAB — CBC
HEMATOCRIT: 32.9 % — AB (ref 36.0–46.0)
Hemoglobin: 10.4 g/dL — ABNORMAL LOW (ref 12.0–15.0)
MCH: 27 pg (ref 26.0–34.0)
MCHC: 31.6 g/dL (ref 30.0–36.0)
MCV: 85.5 fL (ref 78.0–100.0)
Platelets: 99 10*3/uL — ABNORMAL LOW (ref 150–400)
RBC: 3.85 MIL/uL — AB (ref 3.87–5.11)
RDW: 14.4 % (ref 11.5–15.5)
WBC: 6.5 10*3/uL (ref 4.0–10.5)

## 2017-04-09 LAB — BASIC METABOLIC PANEL
ANION GAP: 7 (ref 5–15)
BUN: 10 mg/dL (ref 6–20)
CALCIUM: 8.5 mg/dL — AB (ref 8.9–10.3)
CO2: 27 mmol/L (ref 22–32)
Chloride: 101 mmol/L (ref 101–111)
Creatinine, Ser: 0.54 mg/dL (ref 0.44–1.00)
GFR calc non Af Amer: >60 mL/min (ref >60)
GLUCOSE: 108 mg/dL — AB (ref 65–99)
Potassium: 3.9 mmol/L (ref 3.5–5.1)
Sodium: 135 mmol/L (ref 135–145)

## 2017-04-09 MED ORDER — SODIUM CHLORIDE 0.9 % IV BOLUS (SEPSIS)
500.0000 mL | Freq: Once | INTRAVENOUS | Status: AC
  Administered 2017-04-09: 500 mL via INTRAVENOUS

## 2017-04-09 NOTE — Progress Notes (Signed)
   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  Precautions  Precautions Posterior Hip  Precaution Comments recalled 2/3 thps  Pain Assessment  Pain Score 2  Pain Location right hip  Pain Descriptors / Indicators Discomfort  Pain Intervention(s) Limited activity within patient's tolerance;Monitored during session;Premedicated before session;Repositioned;Ice applied  Cognition  Arousal/Alertness Awake/alert  Behavior During Therapy WFL for tasks assessed/performed  Overall Cognitive Status Within Functional Limits for tasks assessed  ADL  Lower Body Dressing Moderate assistance;Sit to/from stand;With adaptive equipment  General ADL Comments pt will have assistance x 10 days then intermittent. Reminded pt and educated friend on limitations during adls.  Pt self-caths. She may not be able to do this sitting following her precautions:  gave her a urinal for helper to hold if she needs to try this in standing. Reviewe shower transfer again and gave pt a handout.  Bed Mobility  General bed mobility comments oob  Restrictions  RLE Weight Bearing WBAT  OT - End of Session  Activity Tolerance Patient tolerated treatment well  Patient left in chair;with call bell/phone within reach;with family/visitor present  OT Assessment/Plan  OT Visit Diagnosis Pain  Pain - Right/Left Right  Pain - part of body Hip  Follow Up Recommendations Supervision/Assistance - 24 hour  OT Equipment 3 in 1 bedside commode  AM-PAC OT "6 Clicks" Daily Activity Outcome Measure  Help from another person eating meals? 4  Help from another person taking care of personal grooming? 3  Help from another person toileting, which includes using toliet, bedpan, or urinal? 3  Help from another person bathing (including washing, rinsing, drying)? 2  Help from another person to put on and taking off regular  upper body clothing? 3  Help from another person to put on and taking off regular lower body clothing? 2  6 Click Score 17  ADL G Code Conversion CK  OT Goal Progression  Progress towards OT goals Progressing toward goals  OT Time Calculation  OT Start Time (ACUTE ONLY) 1542  OT Stop Time (ACUTE ONLY) 1604  OT Time Calculation (min) 22 min  OT General Charges  $OT Visit 1 Visit  OT Treatments  $Self Care/Home Management  8-22 mins  Lesle Chris, OTR/L [phone] 04/09/2017

## 2017-04-09 NOTE — Progress Notes (Signed)
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  Assisted OOB to amb only 12 feet when started to c/o feeling "bad" and dizzy.  Recliner brought to pt.   Supine     BP 110/48, HR 77, RA 89% EOB        BP 122/50, HR 81, RA 95% amb         BP 75/44,  HR 95,  RA 100%  Reported to RN    Follow Up Recommendations  Home health PT     Equipment Recommendations  Rolling walker with 5" wheels;3in1 (PT)    Recommendations for Other Services       Precautions / Restrictions Precautions Precautions: Posterior Hip Precaution Comments: recalled 1/3 thps Restrictions Weight Bearing Restrictions: No RLE Weight Bearing: Weight bearing as tolerated    Mobility  Bed Mobility Overal bed mobility: Needs Assistance Bed Mobility: Supine to Sit     Supine to sit: Min assist     General bed mobility comments: assist L LE and increased time with caution to adhere to THP   Transfers Overall transfer level: Needs assistance Equipment used: Rolling walker (2 wheeled) Transfers: Sit to/from Stand Sit to Stand: Min guard         General transfer comment: cues for THPS  Ambulation/Gait Ambulation/Gait assistance: Min guard Ambulation Distance (Feet): 12 Feet Assistive device: Rolling walker (2 wheeled) Gait Pattern/deviations: Step-to pattern;Decreased stance time - right Gait velocity: decreased   General Gait Details: limited amb distance due to increased dizziness with drop in BP from 122/50 to 75/44.  Assisted to recliner and RN notified.   Stairs            Wheelchair Mobility    Modified Rankin (Stroke Patients Only)       Balance                                            Cognition Arousal/Alertness:  Awake/alert Behavior During Therapy: WFL for tasks assessed/performed Overall Cognitive Status: Within Functional Limits for tasks assessed                                        Exercises      General Comments        Pertinent Vitals/Pain Pain Assessment: 0-10 Pain Score: 2  Pain Location: right hip Pain Descriptors / Indicators: Discomfort Pain Intervention(s): Monitored during session;Repositioned    Home Living Family/patient expects to be discharged to:: Private residence Living Arrangements: Children Available Help at Discharge: Family Type of Home: Apartment       Home Equipment: Shower seat - built in Additional Comments: reacher    Prior Function Level of Independence: Independent      Comments: from [facility]   PT Goals (current goals can now be found in the care plan section) Acute Rehab PT Goals Patient Stated Goal: home Progress towards PT goals: Progressing toward goals    Frequency    7X/week      PT Plan Current plan remains appropriate    Co-evaluation  AM-PAC PT "6 Clicks" Daily Activity  Outcome Measure  Difficulty turning over in bed (including adjusting bedclothes, sheets and blankets)?: A Lot Difficulty moving from lying on back to sitting on the side of the bed? : A Lot Difficulty sitting down on and standing up from a chair with arms (e.g., wheelchair, bedside commode, etc,.)?: A Lot Help needed moving to and from a bed to chair (including a wheelchair)?: A Lot Help needed walking in hospital room?: A Lot Help needed climbing 3-5 steps with a railing? : Total 6 Click Score: 11    End of Session Equipment Utilized During Treatment: Gait belt Activity Tolerance: Other (comment) Patient left: in chair Nurse Communication: Mobility status PT Visit Diagnosis: Unsteadiness on feet (R26.81);Pain Pain - Right/Left: Right Pain - part of body: Hip     Time: 8333-8329 PT Time  Calculation (min) (ACUTE ONLY): 30 min  Charges:  $Gait Training: 8-22 mins $Therapeutic Activity: 8-22 mins                    G Codes:       Rica Koyanagi  PTA [facility]  Acute  Rehab Pager      [phone]

## 2017-04-09 NOTE — Progress Notes (Signed)
   Subjective: 2 Days Post-Op Procedure(s) (LRB): RIGHT HIP ACETABULAR REVISION (Right) Patient reports pain as mild.   Plan is to go Home after hospital stay.  Objective: Vital signs in last 24 hours: Temp:  [98.1 F (36.7 C)-98.4 F (36.9 C)] 98.2 F (36.8 C) (03/22 0524) Pulse Rate:  [71-100] 79 (03/22 0524) Resp:  [15-16] 16 (03/22 0524) BP: (102-126)/(50-57) 125/52 (03/22 0524) SpO2:  [92 %-100 %] 100 % (03/22 0524)  Intake/Output from previous day:  Intake/Output Summary (Last 24 hours) at 04/09/2017 0719 Last data filed at 04/09/2017 0600 Gross per 24 hour  Intake 2587.5 ml  Output 2100 ml  Net 487.5 ml    Intake/Output this shift: No intake/output data recorded.  Labs: Recent Labs    04/08/17 0555 04/09/17 0544  HGB 10.8* 10.4*   Recent Labs    04/08/17 0555 04/09/17 0544  WBC 6.6 6.5  RBC 4.09 3.85*  HCT 34.7* 32.9*  PLT 116* 99*   Recent Labs    04/08/17 0555 04/09/17 0544  NA 139 135  K 3.9 3.9  CL 104 101  CO2 28 27  BUN 10 10  CREATININE 0.61 0.54  GLUCOSE 128* 108*  CALCIUM 8.4* 8.5*   No results for input(s): LABPT, INR in the last 72 hours.  EXAM General - Patient is Alert, Appropriate and Oriented Extremity - Neurologically intact Neurovascular intact Incision: dressing C/D/I No cellulitis present Compartment soft Dressing/Incision - clean, dry, no drainage Motor Function - intact, moving foot and toes well on exam.   Past Medical History:  Diagnosis Date  . Arthritis   . Blood dyscrasia    low platelets  . Cancer ([place])    skin cancer (removed)  . Complication of anesthesia   . DVT (deep venous thrombosis) ([place])    left lower extremity once in 2015 after long car ride, refused chronic anticoagulation  . FH: pulmonary embolism 10/12/2013  . Frequent UTI    hx of   . GERD (gastroesophageal reflux disease)   . Heart murmur    was told years ago she had a murmur but no one mentions it now.  Marland Kitchen Heterozygous factor V Leiden  mutation ([place])   . History of hiatal hernia   . Hoarseness    sees [facility]  . Intermittent self-catheterization of bladder    chronic urinary retention, sees urologist  . Peripheral vascular disease ([place])   . PONV (postoperative nausea and vomiting)   . Vertigo    2016  . Vitamin B deficiency   . Vitamin D deficiency     Assessment/Plan: 2 Days Post-Op Procedure(s) (LRB): RIGHT HIP ACETABULAR REVISION (Right) Principal Problem:   Failed total hip arthroplasty ([place]) Active Problems:   Failed total hip arthroplasty, sequela   Up with therapy Discharge home with home health  DVT Prophylaxis - Xarelto Weight Bearing As Tolerated right Leg  Diane Roach 04/09/2017, 7:19 AM

## 2017-04-09 NOTE — Evaluation (Signed)
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 She needs reinforcement for posterior THPS.  Pt became dizzy and nauseous at end of session. When returned to supine BP was 119/45; RN aware.  Will follow in acute setting with goals listed below.    Follow Up Recommendations  Supervision/Assistance - 24 hour    Equipment Recommendations  3 in 1 bedside commode    Recommendations for Other Services       Precautions / Restrictions Precautions Precautions: Posterior Hip Precaution Comments: recalled 2/3 thps Restrictions Weight Bearing Restrictions: No RLE Weight Bearing: Weight bearing as tolerated      Mobility Bed Mobility         Supine to sit: Min assist (and sit to supine)     General bed mobility comments: for RLE; educated on leg lifter but did not use for back to bed as pt was lightheaded and dizzy  Transfers   Equipment used: Rolling walker (2 wheeled)   Sit to Stand: Min guard         General transfer comment: cues for THPS    Balance                                           ADL either performed or assessed with clinical judgement   ADL Overall ADL's : Needs assistance/impaired     Grooming: Supervision/safety;Standing;Wash/dry hands   Upper Body Bathing: Set up;Sitting   Lower Body Bathing: Min guard;With adaptive equipment;Sit to/from stand   Upper Body Dressing : Set up;Sitting   Lower Body Dressing: Maximal assistance;Sit to/from stand(reacher for pants)   Toilet Transfer: Min guard;Ambulation;BSC;RW   Toileting- Water quality scientist and Hygiene: Min guard;Sit to/from stand   Tub/ Shower Transfer: Walk-in shower;Min guard;Ambulation;3 in 1     General ADL Comments: simulated  shower transfer as pt has lower ledge than ours.  Cues for THPs with toileting.Marland Kitchen Pt plans to get a new long handled sponge.     Vision         Perception     Praxis      Pertinent Vitals/Pain Pain Score: 4  Pain Location: right hip Pain Descriptors / Indicators: Discomfort Pain Intervention(s): Limited activity within patient's tolerance;Monitored during session;Premedicated before session;Repositioned;Ice applied     Hand Dominance     Extremity/Trunk Assessment Upper Extremity Assessment Upper Extremity Assessment: Overall WFL for tasks assessed           Communication Communication Communication: No difficulties   Cognition Arousal/Alertness: Awake/alert Behavior During Therapy: WFL for tasks assessed/performed Overall Cognitive Status: Within Functional Limits for tasks assessed                                     General Comments       Exercises     Shoulder Instructions      Home Living Family/patient expects to be discharged to:: Private residence Living Arrangements: Children Available Help at Discharge: Family Type of Home: Apartment             Bathroom Shower/Tub: Occupational psychologist: Standard  Home Equipment: Shower seat - built in   Additional Comments: reacher      Prior Functioning/Environment Level of Independence: Independent        Comments: from [facility]        OT Problem List: Decreased activity tolerance;Pain;Cardiopulmonary status limiting activity;Decreased knowledge of use of DME or AE      OT Treatment/Interventions: Self-care/ADL training;DME and/or AE instruction;Patient/family education    OT Goals(Current goals can be found in the care plan section) Acute Rehab OT Goals Patient Stated Goal: home OT Goal Formulation: With patient Time For Goal Achievement: 04/23/17 Potential to Achieve Goals: Good ADL Goals Additional ADL Goal #1: pt will verbalize AE for  adls and not need any cues for THPS during adl Additional ADL Goal #2: pt will be supervision with leg lifter for OOB in preparation for adls  OT Frequency: Min 2X/week   Barriers to D/C:            Co-evaluation              AM-PAC PT "6 Clicks" Daily Activity     Outcome Measure Help from another person eating meals?: None Help from another person taking care of personal grooming?: A Little Help from another person toileting, which includes using toliet, bedpan, or urinal?: A Little Help from another person bathing (including washing, rinsing, drying)?: A Lot Help from another person to put on and taking off regular upper body clothing?: A Little Help from another person to put on and taking off regular lower body clothing?: A Lot 6 Click Score: 17   End of Session    Activity Tolerance: (limited by nausea/dizziness) Patient left: in bed;with call bell/phone within reach  OT Visit Diagnosis: Pain Pain - Right/Left: Right Pain - part of body: Hip                Time: 4481-8563 OT Time Calculation (min): 31 min Charges:  OT General Charges $OT Visit: 1 Visit OT Evaluation $OT Eval Low Complexity: 1 Low OT Treatments $Self Care/Home Management : 8-22 mins G-Codes:     [place], OTR/L [phone] 04/09/2017  Sanja Elizardo 04/09/2017, 10:43 AM

## 2017-04-09 NOTE — Progress Notes (Signed)
    Durable Medical Equipment  (From admission, onward)        Start     Ordered   04/09/17 1115  For home use only DME Walker rolling  Once    Question:  Patient needs a walker to treat with the following condition  Answer:  S/P hip hemiarthroplasty   04/09/17 1115   04/08/17 1140  For home use only DME 3 n 1  Once     04/08/17 1139

## 2017-04-10 DIAGNOSIS — D6851 Activated protein C resistance: Secondary | ICD-10-CM | POA: Diagnosis not present

## 2017-04-10 DIAGNOSIS — T84090D Other mechanical complication of internal right hip prosthesis, subsequent encounter: Secondary | ICD-10-CM | POA: Diagnosis not present

## 2017-04-12 DIAGNOSIS — T84090D Other mechanical complication of internal right hip prosthesis, subsequent encounter: Secondary | ICD-10-CM | POA: Diagnosis not present

## 2017-04-12 DIAGNOSIS — D6851 Activated protein C resistance: Secondary | ICD-10-CM | POA: Diagnosis not present

## 2017-04-14 ENCOUNTER — Telehealth: Payer: Self-pay | Admitting: Family Medicine

## 2017-04-14 DIAGNOSIS — D6851 Activated protein C resistance: Secondary | ICD-10-CM | POA: Diagnosis not present

## 2017-04-14 DIAGNOSIS — T84090D Other mechanical complication of internal right hip prosthesis, subsequent encounter: Secondary | ICD-10-CM | POA: Diagnosis not present

## 2017-04-14 NOTE — Telephone Encounter (Signed)
She should be seen.

## 2017-04-14 NOTE — Telephone Encounter (Signed)
Can you offer any further advice for pt, since Dr. Maudie Mercury is out of office until next week?

## 2017-04-14 NOTE — Telephone Encounter (Signed)
Copied from [place] [phone]. Topic: Quick Communication - See Telephone Encounter >> Apr 14, 2017  1:15 PM Ivar Drape wrote: CRM for notification. See Telephone encounter for: 04/14/17. Danie Binder w/[facility] [phone] wants the provider to know that the patient has not had a bowel movement since her surgery on 04/07/17.  She is now taking Colace 149ml but it's not doing anything.  Please advise.

## 2017-04-14 NOTE — Telephone Encounter (Signed)
I spoke with pt and recommended that she needed to be seen, she is going to call GI now.

## 2017-04-16 ENCOUNTER — Telehealth: Payer: Self-pay | Admitting: Family Medicine

## 2017-04-16 DIAGNOSIS — D6851 Activated protein C resistance: Secondary | ICD-10-CM | POA: Diagnosis not present

## 2017-04-16 DIAGNOSIS — T84090D Other mechanical complication of internal right hip prosthesis, subsequent encounter: Secondary | ICD-10-CM | POA: Diagnosis not present

## 2017-04-16 NOTE — Telephone Encounter (Signed)
Copied from [place] [phone]. Topic: Quick Communication - See Telephone Encounter >> Apr 16, 2017  2:15 PM Bea Graff, NT wrote: CRM for notification. See Telephone encounter for: 04/16/17.Renee with [facility] OT needing verbal orders to continue home health OT. Frequency 1 week 1 visit, 2 times a week 2 weeks and 1 time a week for 1 week. CB#: [phone]

## 2017-04-19 ENCOUNTER — Telehealth: Payer: Self-pay | Admitting: Family Medicine

## 2017-04-19 DIAGNOSIS — D6851 Activated protein C resistance: Secondary | ICD-10-CM | POA: Diagnosis not present

## 2017-04-19 DIAGNOSIS — T84090D Other mechanical complication of internal right hip prosthesis, subsequent encounter: Secondary | ICD-10-CM | POA: Diagnosis not present

## 2017-04-19 NOTE — Telephone Encounter (Signed)
I left a detailed message with the information below at Renee's cell number.

## 2017-04-19 NOTE — Telephone Encounter (Signed)
Should come from managing doctor. Ortho?

## 2017-04-19 NOTE — Telephone Encounter (Signed)
Copied from [place] [phone]. Topic: Quick Communication - See Telephone Encounter >> Apr 19, 2017  9:36 AM Ahmed Prima L wrote: CRM for notification. See Telephone encounter for: 04/19/17.  Renee home health OT therapist is needing verbals to see her one time a week for four weeks. Call back is [phone]

## 2017-04-19 NOTE — Telephone Encounter (Signed)
Should come from managing specialist - ortho.

## 2017-04-20 DIAGNOSIS — T84090D Other mechanical complication of internal right hip prosthesis, subsequent encounter: Secondary | ICD-10-CM | POA: Diagnosis not present

## 2017-04-20 DIAGNOSIS — D6851 Activated protein C resistance: Secondary | ICD-10-CM | POA: Diagnosis not present

## 2017-04-21 DIAGNOSIS — D6851 Activated protein C resistance: Secondary | ICD-10-CM | POA: Diagnosis not present

## 2017-04-21 DIAGNOSIS — T84090D Other mechanical complication of internal right hip prosthesis, subsequent encounter: Secondary | ICD-10-CM | POA: Diagnosis not present

## 2017-04-22 DIAGNOSIS — T84090D Other mechanical complication of internal right hip prosthesis, subsequent encounter: Secondary | ICD-10-CM | POA: Diagnosis not present

## 2017-04-22 DIAGNOSIS — D6851 Activated protein C resistance: Secondary | ICD-10-CM | POA: Diagnosis not present

## 2017-04-23 DIAGNOSIS — D6851 Activated protein C resistance: Secondary | ICD-10-CM | POA: Diagnosis not present

## 2017-04-23 DIAGNOSIS — T84090D Other mechanical complication of internal right hip prosthesis, subsequent encounter: Secondary | ICD-10-CM | POA: Diagnosis not present

## 2017-04-26 DIAGNOSIS — D6851 Activated protein C resistance: Secondary | ICD-10-CM | POA: Diagnosis not present

## 2017-04-26 DIAGNOSIS — T84090D Other mechanical complication of internal right hip prosthesis, subsequent encounter: Secondary | ICD-10-CM | POA: Diagnosis not present

## 2017-04-26 NOTE — Telephone Encounter (Signed)
Fax received from [facility] for a home health aide and I called Renee at the number below and left a detailed message asking that she contact the orthopedic office for orders.  I also called [facility] at [phone] and informed Charlena Cross of this also.

## 2017-04-27 DIAGNOSIS — T84090D Other mechanical complication of internal right hip prosthesis, subsequent encounter: Secondary | ICD-10-CM | POA: Diagnosis not present

## 2017-04-27 DIAGNOSIS — D6851 Activated protein C resistance: Secondary | ICD-10-CM | POA: Diagnosis not present

## 2017-04-29 DIAGNOSIS — D6851 Activated protein C resistance: Secondary | ICD-10-CM | POA: Diagnosis not present

## 2017-04-29 DIAGNOSIS — T84090D Other mechanical complication of internal right hip prosthesis, subsequent encounter: Secondary | ICD-10-CM | POA: Diagnosis not present

## 2017-04-30 DIAGNOSIS — D6851 Activated protein C resistance: Secondary | ICD-10-CM | POA: Diagnosis not present

## 2017-04-30 DIAGNOSIS — T84090D Other mechanical complication of internal right hip prosthesis, subsequent encounter: Secondary | ICD-10-CM | POA: Diagnosis not present

## 2017-05-04 DIAGNOSIS — T84090D Other mechanical complication of internal right hip prosthesis, subsequent encounter: Secondary | ICD-10-CM | POA: Diagnosis not present

## 2017-05-04 DIAGNOSIS — D6851 Activated protein C resistance: Secondary | ICD-10-CM | POA: Diagnosis not present

## 2017-05-05 DIAGNOSIS — T84090D Other mechanical complication of internal right hip prosthesis, subsequent encounter: Secondary | ICD-10-CM | POA: Diagnosis not present

## 2017-05-05 DIAGNOSIS — D6851 Activated protein C resistance: Secondary | ICD-10-CM | POA: Diagnosis not present

## 2017-05-07 DIAGNOSIS — T84090D Other mechanical complication of internal right hip prosthesis, subsequent encounter: Secondary | ICD-10-CM | POA: Diagnosis not present

## 2017-05-07 DIAGNOSIS — D6851 Activated protein C resistance: Secondary | ICD-10-CM | POA: Diagnosis not present

## 2017-05-10 DIAGNOSIS — T84090D Other mechanical complication of internal right hip prosthesis, subsequent encounter: Secondary | ICD-10-CM | POA: Diagnosis not present

## 2017-05-10 DIAGNOSIS — D6851 Activated protein C resistance: Secondary | ICD-10-CM | POA: Diagnosis not present

## 2017-05-11 DIAGNOSIS — Z96641 Presence of right artificial hip joint: Secondary | ICD-10-CM | POA: Diagnosis not present

## 2017-05-11 DIAGNOSIS — Z471 Aftercare following joint replacement surgery: Secondary | ICD-10-CM | POA: Diagnosis not present

## 2017-05-12 DIAGNOSIS — T84090D Other mechanical complication of internal right hip prosthesis, subsequent encounter: Secondary | ICD-10-CM | POA: Diagnosis not present

## 2017-05-12 DIAGNOSIS — D6851 Activated protein C resistance: Secondary | ICD-10-CM | POA: Diagnosis not present

## 2017-05-14 DIAGNOSIS — D6851 Activated protein C resistance: Secondary | ICD-10-CM | POA: Diagnosis not present

## 2017-05-14 DIAGNOSIS — T84090D Other mechanical complication of internal right hip prosthesis, subsequent encounter: Secondary | ICD-10-CM | POA: Diagnosis not present

## 2017-05-18 DIAGNOSIS — D6851 Activated protein C resistance: Secondary | ICD-10-CM | POA: Diagnosis not present

## 2017-05-18 DIAGNOSIS — T84090D Other mechanical complication of internal right hip prosthesis, subsequent encounter: Secondary | ICD-10-CM | POA: Diagnosis not present

## 2017-05-20 DIAGNOSIS — D6851 Activated protein C resistance: Secondary | ICD-10-CM | POA: Diagnosis not present

## 2017-05-20 DIAGNOSIS — T84090D Other mechanical complication of internal right hip prosthesis, subsequent encounter: Secondary | ICD-10-CM | POA: Diagnosis not present

## 2017-05-26 DIAGNOSIS — T84090D Other mechanical complication of internal right hip prosthesis, subsequent encounter: Secondary | ICD-10-CM | POA: Diagnosis not present

## 2017-05-26 DIAGNOSIS — D6851 Activated protein C resistance: Secondary | ICD-10-CM | POA: Diagnosis not present

## 2017-06-03 ENCOUNTER — Telehealth: Payer: Self-pay | Admitting: *Deleted

## 2017-06-03 DIAGNOSIS — T84090D Other mechanical complication of internal right hip prosthesis, subsequent encounter: Secondary | ICD-10-CM | POA: Diagnosis not present

## 2017-06-03 DIAGNOSIS — D6851 Activated protein C resistance: Secondary | ICD-10-CM | POA: Diagnosis not present

## 2017-06-03 NOTE — Telephone Encounter (Signed)
Dr Maudie Mercury received a number of faxes from [facility] [phone]) to be signed for the pt with a note stating someone by the name of Lattie Haw gave orders.  I called [facility] and informed Becky per Dr Maudie Mercury she has not treated the pt and orders should come from the orthopedic doctor. Jacqlyn Larsen stated she will have Dr Julianne Rice name removed from the pts chart.

## 2017-06-29 DIAGNOSIS — Z96641 Presence of right artificial hip joint: Secondary | ICD-10-CM | POA: Diagnosis not present

## 2017-06-29 DIAGNOSIS — M1611 Unilateral primary osteoarthritis, right hip: Secondary | ICD-10-CM | POA: Diagnosis not present

## 2017-06-29 DIAGNOSIS — Z471 Aftercare following joint replacement surgery: Secondary | ICD-10-CM | POA: Diagnosis not present

## 2017-07-13 DIAGNOSIS — M25461 Effusion, right knee: Secondary | ICD-10-CM | POA: Diagnosis not present

## 2017-07-13 DIAGNOSIS — Z96641 Presence of right artificial hip joint: Secondary | ICD-10-CM | POA: Diagnosis not present

## 2017-07-28 ENCOUNTER — Telehealth: Payer: Self-pay | Admitting: *Deleted

## 2017-07-28 NOTE — Telephone Encounter (Signed)
Will send to Dr Maudie Mercury as Diane Roach  Copied from [place] [phone]. Topic: General - Other >> Jul 28, 2017  3:55 PM Margot Ables wrote: Reason for CRM: pt called to notify office she moved to [facility] and wanted to say goodbye to Dr. Maudie Mercury. Her new doctors office will be contacting us for records.

## 2017-07-29 DIAGNOSIS — N3001 Acute cystitis with hematuria: Secondary | ICD-10-CM | POA: Diagnosis not present

## 2017-07-29 NOTE — Telephone Encounter (Signed)
Please le there know we will miss her and wish her all the best in [facility].

## 2017-07-30 NOTE — Telephone Encounter (Signed)
Pt notified  States that we will be getting a records request from [facility] (sp?) for her medical records.  Will send to [facility] to keep an eye out.  This may be coming here or going to [place].  Nothing further needed.

## 2017-08-09 DIAGNOSIS — Z6826 Body mass index (BMI) 26.0-26.9, adult: Secondary | ICD-10-CM | POA: Diagnosis not present

## 2017-08-09 DIAGNOSIS — E785 Hyperlipidemia, unspecified: Secondary | ICD-10-CM | POA: Diagnosis not present

## 2017-08-09 DIAGNOSIS — N390 Urinary tract infection, site not specified: Secondary | ICD-10-CM | POA: Diagnosis not present

## 2017-08-09 DIAGNOSIS — D6851 Activated protein C resistance: Secondary | ICD-10-CM | POA: Diagnosis not present

## 2017-08-09 DIAGNOSIS — R6889 Other general symptoms and signs: Secondary | ICD-10-CM | POA: Diagnosis not present

## 2017-08-17 DIAGNOSIS — M25551 Pain in right hip: Secondary | ICD-10-CM | POA: Diagnosis not present

## 2017-08-17 DIAGNOSIS — Z96641 Presence of right artificial hip joint: Secondary | ICD-10-CM | POA: Diagnosis not present

## 2017-08-19 DIAGNOSIS — M25551 Pain in right hip: Secondary | ICD-10-CM | POA: Diagnosis not present

## 2017-08-19 DIAGNOSIS — Z96641 Presence of right artificial hip joint: Secondary | ICD-10-CM | POA: Diagnosis not present

## 2017-08-23 DIAGNOSIS — Z96641 Presence of right artificial hip joint: Secondary | ICD-10-CM | POA: Diagnosis not present

## 2017-08-23 DIAGNOSIS — M25551 Pain in right hip: Secondary | ICD-10-CM | POA: Diagnosis not present

## 2017-08-25 DIAGNOSIS — Z96641 Presence of right artificial hip joint: Secondary | ICD-10-CM | POA: Diagnosis not present

## 2017-08-25 DIAGNOSIS — M25551 Pain in right hip: Secondary | ICD-10-CM | POA: Diagnosis not present

## 2017-08-27 DIAGNOSIS — M25551 Pain in right hip: Secondary | ICD-10-CM | POA: Diagnosis not present

## 2017-08-27 DIAGNOSIS — Z96641 Presence of right artificial hip joint: Secondary | ICD-10-CM | POA: Diagnosis not present

## 2017-08-30 DIAGNOSIS — Z96641 Presence of right artificial hip joint: Secondary | ICD-10-CM | POA: Diagnosis not present

## 2017-08-30 DIAGNOSIS — M25551 Pain in right hip: Secondary | ICD-10-CM | POA: Diagnosis not present

## 2017-09-01 DIAGNOSIS — M25551 Pain in right hip: Secondary | ICD-10-CM | POA: Diagnosis not present

## 2017-09-01 DIAGNOSIS — Z96641 Presence of right artificial hip joint: Secondary | ICD-10-CM | POA: Diagnosis not present

## 2017-09-02 DIAGNOSIS — R339 Retention of urine, unspecified: Secondary | ICD-10-CM | POA: Diagnosis not present

## 2017-09-02 DIAGNOSIS — N390 Urinary tract infection, site not specified: Secondary | ICD-10-CM | POA: Diagnosis not present

## 2017-09-02 DIAGNOSIS — N39490 Overflow incontinence: Secondary | ICD-10-CM | POA: Diagnosis not present

## 2017-09-03 DIAGNOSIS — M25551 Pain in right hip: Secondary | ICD-10-CM | POA: Diagnosis not present

## 2017-09-03 DIAGNOSIS — Z96641 Presence of right artificial hip joint: Secondary | ICD-10-CM | POA: Diagnosis not present

## 2017-09-06 DIAGNOSIS — M25551 Pain in right hip: Secondary | ICD-10-CM | POA: Diagnosis not present

## 2017-09-06 DIAGNOSIS — Z96641 Presence of right artificial hip joint: Secondary | ICD-10-CM | POA: Diagnosis not present

## 2017-09-08 DIAGNOSIS — Z96641 Presence of right artificial hip joint: Secondary | ICD-10-CM | POA: Diagnosis not present

## 2017-09-08 DIAGNOSIS — M25551 Pain in right hip: Secondary | ICD-10-CM | POA: Diagnosis not present

## 2017-09-10 DIAGNOSIS — M25551 Pain in right hip: Secondary | ICD-10-CM | POA: Diagnosis not present

## 2017-09-10 DIAGNOSIS — Z96641 Presence of right artificial hip joint: Secondary | ICD-10-CM | POA: Diagnosis not present

## 2017-09-13 DIAGNOSIS — Z96641 Presence of right artificial hip joint: Secondary | ICD-10-CM | POA: Diagnosis not present

## 2017-09-13 DIAGNOSIS — M25551 Pain in right hip: Secondary | ICD-10-CM | POA: Diagnosis not present

## 2017-09-17 DIAGNOSIS — Z96641 Presence of right artificial hip joint: Secondary | ICD-10-CM | POA: Diagnosis not present

## 2017-09-17 DIAGNOSIS — M25551 Pain in right hip: Secondary | ICD-10-CM | POA: Diagnosis not present

## 2017-09-22 DIAGNOSIS — R3912 Poor urinary stream: Secondary | ICD-10-CM | POA: Diagnosis not present

## 2017-09-22 DIAGNOSIS — M25551 Pain in right hip: Secondary | ICD-10-CM | POA: Diagnosis not present

## 2017-09-22 DIAGNOSIS — Z96641 Presence of right artificial hip joint: Secondary | ICD-10-CM | POA: Diagnosis not present

## 2017-09-22 DIAGNOSIS — R3914 Feeling of incomplete bladder emptying: Secondary | ICD-10-CM | POA: Diagnosis not present

## 2017-09-22 DIAGNOSIS — R339 Retention of urine, unspecified: Secondary | ICD-10-CM | POA: Diagnosis not present

## 2017-09-22 DIAGNOSIS — M62838 Other muscle spasm: Secondary | ICD-10-CM | POA: Diagnosis not present

## 2017-09-24 DIAGNOSIS — M25551 Pain in right hip: Secondary | ICD-10-CM | POA: Diagnosis not present

## 2017-09-24 DIAGNOSIS — Z96641 Presence of right artificial hip joint: Secondary | ICD-10-CM | POA: Diagnosis not present

## 2017-09-27 DIAGNOSIS — H35363 Drusen (degenerative) of macula, bilateral: Secondary | ICD-10-CM | POA: Diagnosis not present

## 2017-09-27 DIAGNOSIS — H5203 Hypermetropia, bilateral: Secondary | ICD-10-CM | POA: Diagnosis not present

## 2017-09-27 DIAGNOSIS — H25811 Combined forms of age-related cataract, right eye: Secondary | ICD-10-CM | POA: Diagnosis not present

## 2017-09-29 DIAGNOSIS — M25551 Pain in right hip: Secondary | ICD-10-CM | POA: Diagnosis not present

## 2017-09-29 DIAGNOSIS — Z96641 Presence of right artificial hip joint: Secondary | ICD-10-CM | POA: Diagnosis not present

## 2017-09-30 DIAGNOSIS — N952 Postmenopausal atrophic vaginitis: Secondary | ICD-10-CM | POA: Diagnosis not present

## 2017-09-30 DIAGNOSIS — N3020 Other chronic cystitis without hematuria: Secondary | ICD-10-CM | POA: Diagnosis not present

## 2017-09-30 DIAGNOSIS — D494 Neoplasm of unspecified behavior of bladder: Secondary | ICD-10-CM | POA: Diagnosis not present

## 2017-09-30 DIAGNOSIS — R339 Retention of urine, unspecified: Secondary | ICD-10-CM | POA: Diagnosis not present

## 2017-09-30 DIAGNOSIS — R350 Frequency of micturition: Secondary | ICD-10-CM | POA: Diagnosis not present

## 2017-09-30 DIAGNOSIS — N210 Calculus in bladder: Secondary | ICD-10-CM | POA: Diagnosis not present

## 2017-10-01 DIAGNOSIS — Z96641 Presence of right artificial hip joint: Secondary | ICD-10-CM | POA: Diagnosis not present

## 2017-10-01 DIAGNOSIS — M25551 Pain in right hip: Secondary | ICD-10-CM | POA: Diagnosis not present

## 2017-10-04 DIAGNOSIS — M25551 Pain in right hip: Secondary | ICD-10-CM | POA: Diagnosis not present

## 2017-10-04 DIAGNOSIS — Z96641 Presence of right artificial hip joint: Secondary | ICD-10-CM | POA: Diagnosis not present

## 2017-10-06 DIAGNOSIS — M25551 Pain in right hip: Secondary | ICD-10-CM | POA: Diagnosis not present

## 2017-10-06 DIAGNOSIS — Z96641 Presence of right artificial hip joint: Secondary | ICD-10-CM | POA: Diagnosis not present

## 2017-10-13 DIAGNOSIS — Z96641 Presence of right artificial hip joint: Secondary | ICD-10-CM | POA: Diagnosis not present

## 2017-11-01 DIAGNOSIS — D6851 Activated protein C resistance: Secondary | ICD-10-CM | POA: Diagnosis not present

## 2017-11-01 DIAGNOSIS — E785 Hyperlipidemia, unspecified: Secondary | ICD-10-CM | POA: Diagnosis not present

## 2017-12-02 ENCOUNTER — Ambulatory Visit: Payer: Medicare Other | Admitting: Family Medicine

## 2017-12-07 DIAGNOSIS — E785 Hyperlipidemia, unspecified: Secondary | ICD-10-CM | POA: Diagnosis not present

## 2017-12-07 DIAGNOSIS — Z79899 Other long term (current) drug therapy: Secondary | ICD-10-CM | POA: Diagnosis not present

## 2017-12-23 DIAGNOSIS — Z1212 Encounter for screening for malignant neoplasm of rectum: Secondary | ICD-10-CM | POA: Diagnosis not present

## 2018-02-04 DIAGNOSIS — Z23 Encounter for immunization: Secondary | ICD-10-CM | POA: Diagnosis not present

## 2018-03-14 DIAGNOSIS — Z6827 Body mass index (BMI) 27.0-27.9, adult: Secondary | ICD-10-CM | POA: Diagnosis not present

## 2018-03-14 DIAGNOSIS — L989 Disorder of the skin and subcutaneous tissue, unspecified: Secondary | ICD-10-CM | POA: Diagnosis not present

## 2018-03-16 DIAGNOSIS — Z1231 Encounter for screening mammogram for malignant neoplasm of breast: Secondary | ICD-10-CM | POA: Diagnosis not present

## 2018-03-22 DIAGNOSIS — Z6827 Body mass index (BMI) 27.0-27.9, adult: Secondary | ICD-10-CM | POA: Diagnosis not present

## 2018-03-22 DIAGNOSIS — H9202 Otalgia, left ear: Secondary | ICD-10-CM | POA: Diagnosis not present

## 2018-04-16 DIAGNOSIS — R319 Hematuria, unspecified: Secondary | ICD-10-CM | POA: Diagnosis not present

## 2018-04-16 DIAGNOSIS — R350 Frequency of micturition: Secondary | ICD-10-CM | POA: Diagnosis not present

## 2018-04-16 DIAGNOSIS — Z6827 Body mass index (BMI) 27.0-27.9, adult: Secondary | ICD-10-CM | POA: Diagnosis not present

## 2018-04-16 DIAGNOSIS — J302 Other seasonal allergic rhinitis: Secondary | ICD-10-CM | POA: Diagnosis not present

## 2018-05-17 IMAGING — MR MR HEAD WO/W CM
12 of 14 series · 39 of 48 positions shown · IV contrast (multihance)
Comparison: None.

CLINICAL DATA: Balance disturbance. Visual disturbance. Voice
change.

Creatinine was obtained on site at [HOSPITAL] at [HOSPITAL].Results: Creatinine 0.7 mg/dL.
EXAM:
MRI HEAD WITHOUT AND WITH CONTRAST
MRA HEAD WITHOUT CONTRAST
TECHNIQUE: Multiplanar, multiecho pulse sequences of the brain and surrounding
structures were obtained without and with intravenous contrast.
Angiographic images of the head were obtained using MRA technique
without contrast.
CONTRAST:  15mL MULTIHANCE GADOBENATE DIMEGLUMINE 529 MG/ML IV SOLN

[Series 3: tof_3d_multi-slab · axial · 0.7mm · 0.35mm/px · z∈[-128,-15]mm · 8 of 162 slices shown]
[im 1/162]
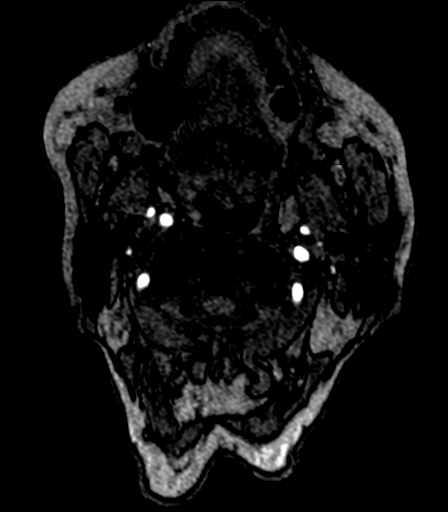
[im 24/162]
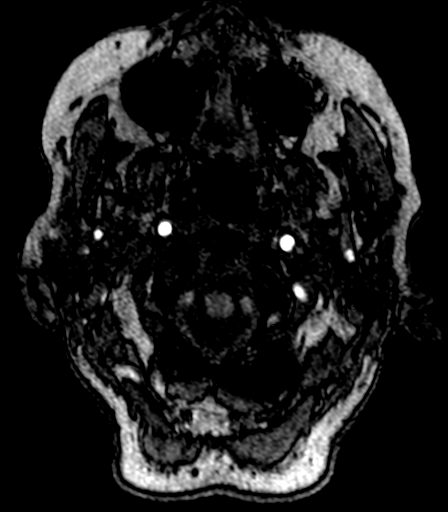
[im 47/162]
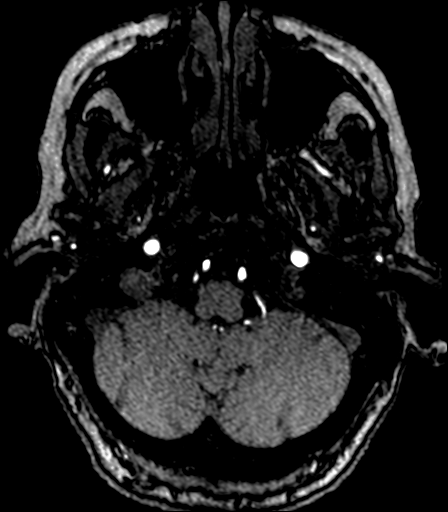
[im 70/162]
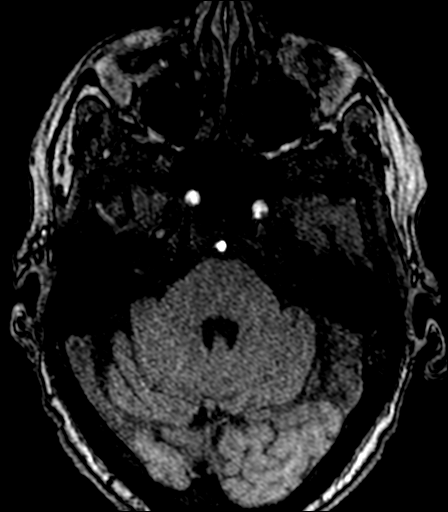
[im 93/162]
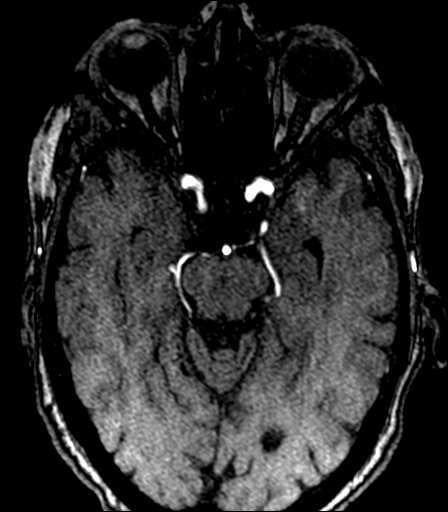
[im 116/162]
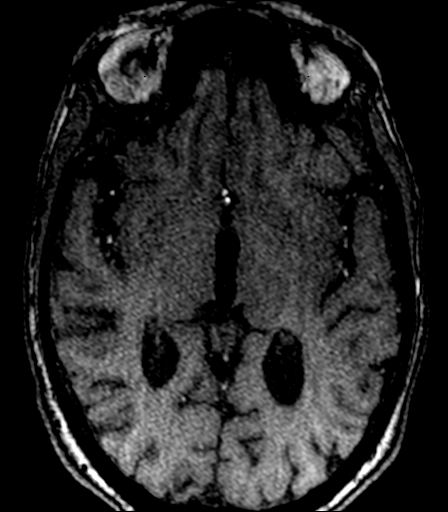
[im 139/162]
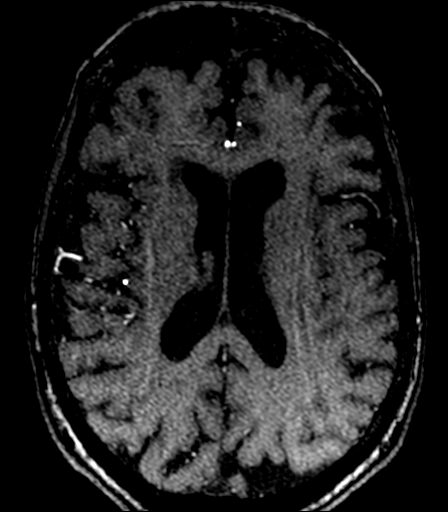
[im 162/162]
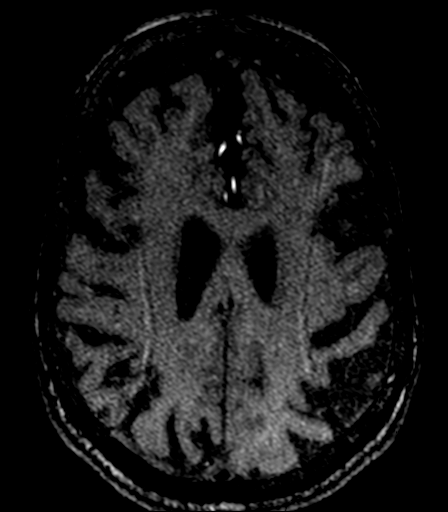

[Series 7: T1 · sagittal · 5.0mm · 0.45mm/px · 2 of 24 slices shown]
[im 1/24]
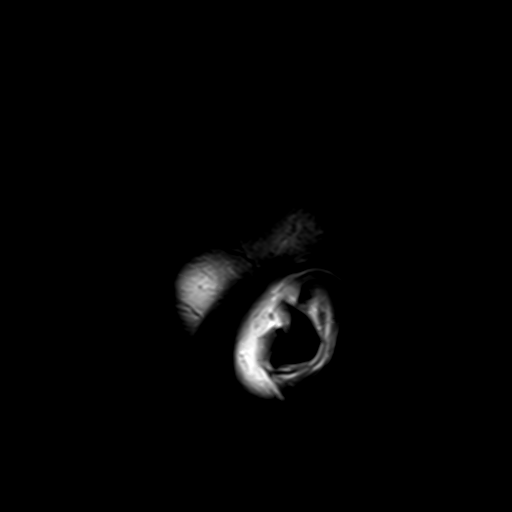
[im 24/24]
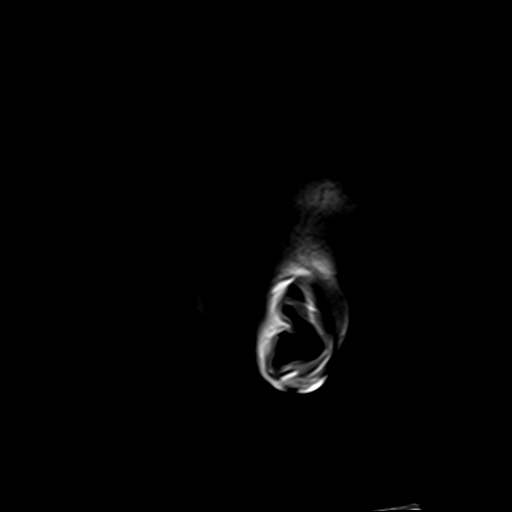

[Series 10: DWI · axial · 3.0mm · 1.80mm/px · z∈[-117,+44]mm · 5 of 110 slices shown (1 of 4)]
[im 1/110]
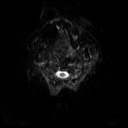
[im 28/110]
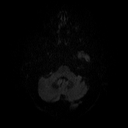
[im 55/110]
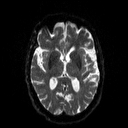
[im 82/110]
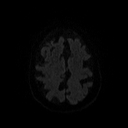
[im 110/110]
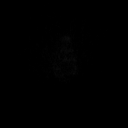

[Series 11: DWI · axial · 3.0mm · 1.80mm/px · z∈[-117,+44]mm · 3 of 55 slices shown (2 of 4)]
[im 1/55]
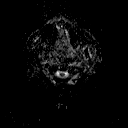
[im 28/55]
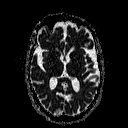
[im 55/55]
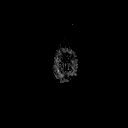

[Series 15: mip_images(sw) · axial · 16.0mm · 0.90mm/px · z∈[-106,+37]mm · 3 of 73 slices shown]
[im 1/73]
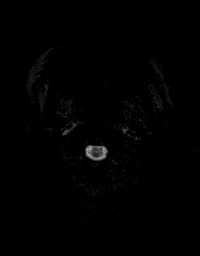
[im 37/73]
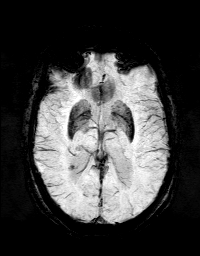
[im 73/73]
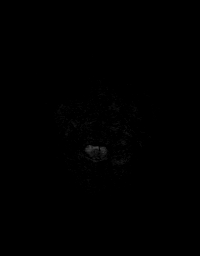

[Series 16: swi_images · axial · 2.0mm · 0.90mm/px · z∈[-113,+44]mm · 4 of 80 slices shown]
[im 1/80]
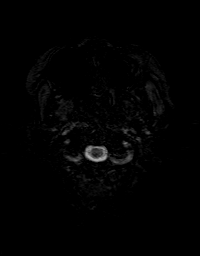
[im 27/80]
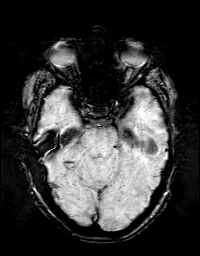
[im 53/80]
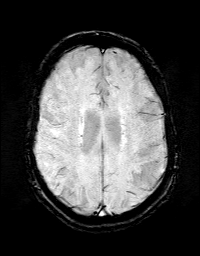
[im 80/80]
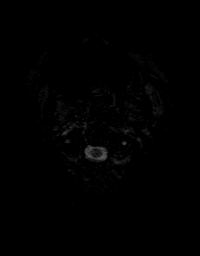

[Series 17: DWI · coronal · 5.0mm · 1.80mm/px · 3 of 74 slices shown (3 of 4)]
[im 1/74]
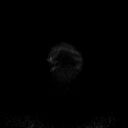
[im 37/74]
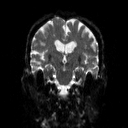
[im 74/74]
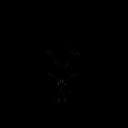

[Series 18: DWI · coronal · 5.0mm · 1.80mm/px · 2 of 38 slices shown (4 of 4)]
[im 1/38]
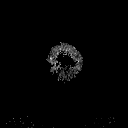
[im 38/38]
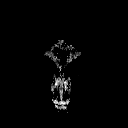

[Series 19: T2 · axial · 5.0mm · 0.51mm/px · 1 of 25 slices shown (1 of 2)]
[im 1/25]
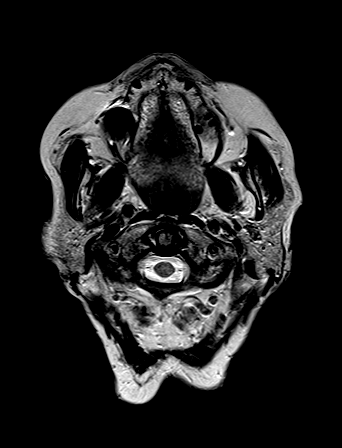

[Series 20: FLAIR · axial · 3.0mm · 0.45mm/px · 1 of 28 slices shown]
[im 1/28]
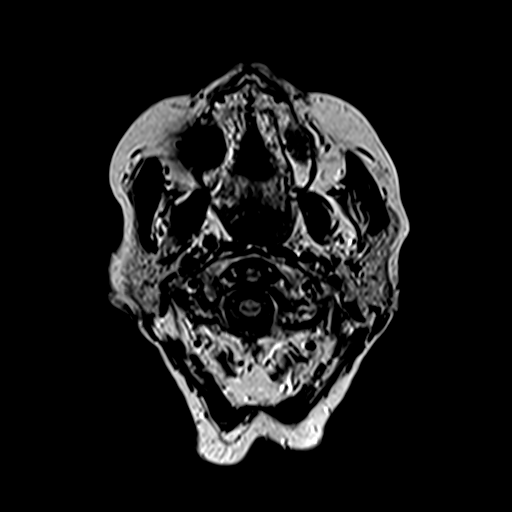

[Series 23: t1_mpr_tra · axial · 1.0mm · 0.45mm/px · z∈[-113,+18]mm · 6 of 160 slices shown]
[im 1/160]
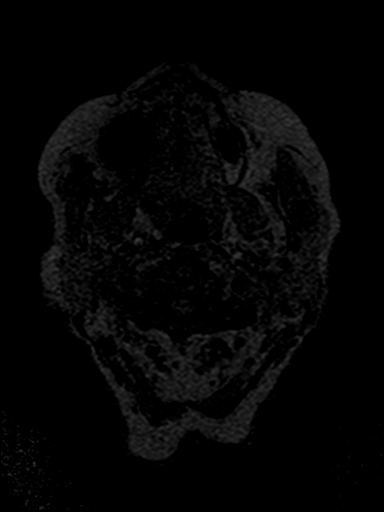
[im 27/160]
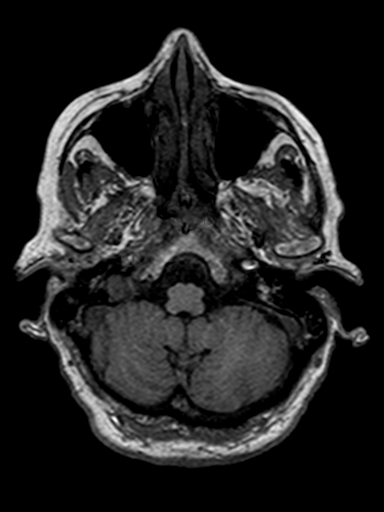
[im 54/160]
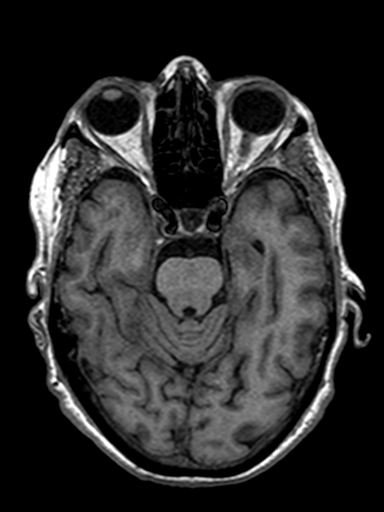
[im 80/160]
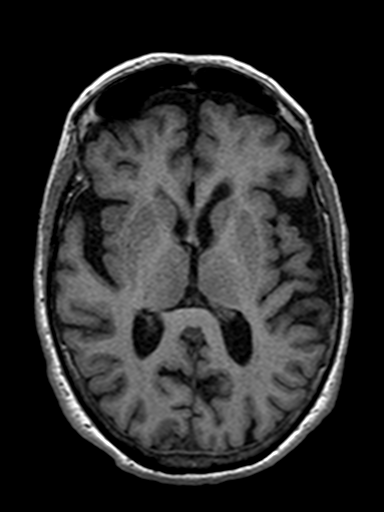
[im 107/160]
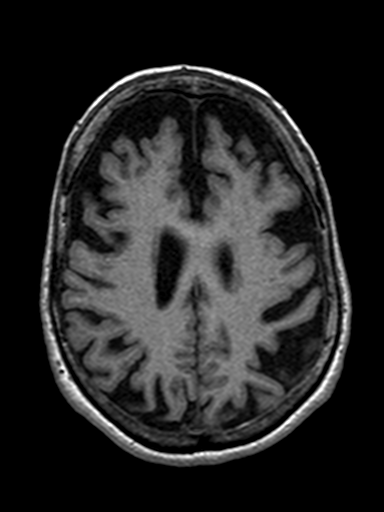
[im 133/160]
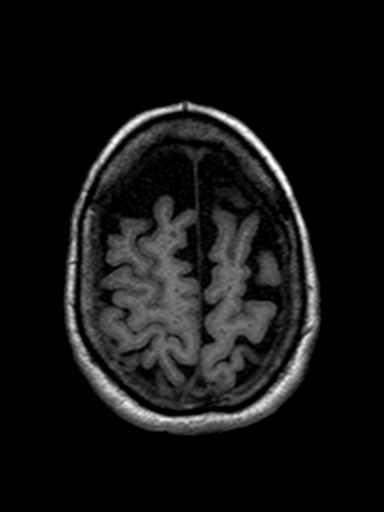

[Series 24: T2 · coronal · 5.0mm · 0.45mm/px · 1 of 31 slices shown (2 of 2)]
[im 1/31]
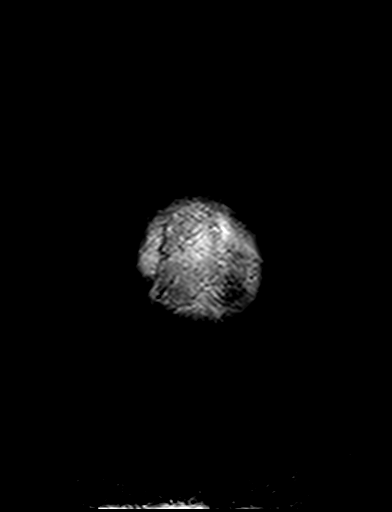

[39 of 48 positions shown; findings below may reference images not displayed]

FINDINGS: MRI HEAD FINDINGS

Brain: Diffusion imaging does not show any acute or subacute
infarction. The brainstem and cerebellum are normal. Cerebral
hemispheres show scattered small foci of T2 and FLAIR signal within
the deep and subcortical white matter consistent with mild chronic
small-vessel ischemic change. There is ordinary age related volume
loss. No cortical or large vessel territory infarction. No mass
lesion, hemorrhage, hydrocephalus or extra-axial collection. After
contrast administration, no abnormal enhancement occurs.

Vascular: Major vessels at the base of the brain show flow.

Skull and upper cervical spine: Normal

Sinuses/Orbits: Sinuses are clear. Previous lens implant on the
left.

Other: None

MRA HEAD FINDINGS

Both internal carotid arteries are widely patent into the brain. The
anterior and middle cerebral vessels are patent without proximal
stenosis, aneurysm or vascular malformation. Fetal origin left PCA.
Both vertebral arteries are widely patent to the basilar. No basilar
stenosis. Posterior circulation branch vessels are normal.
IMPRESSION: No acute or reversible finding. Age related volume loss. Mild
chronic small-vessel ischemic change of the hemispheric white
matter.

Negative intracranial MR angiography of the large and medium size
vessels.

## 2018-06-15 ENCOUNTER — Telehealth: Payer: Self-pay | Admitting: *Deleted

## 2018-06-15 NOTE — Telephone Encounter (Signed)
Error

## 2019-01-04 ENCOUNTER — Other Ambulatory Visit: Payer: Self-pay | Admitting: *Deleted

## 2019-07-31 ENCOUNTER — Other Ambulatory Visit: Payer: Self-pay | Admitting: Oncology
